# Patient Record
Sex: Female | Born: 2000
Health system: Southern US, Community
[De-identification: ages and names within clinical notes are randomized; demographics above are authoritative.]

## PROBLEM LIST (undated history)

## (undated) ENCOUNTER — Inpatient Hospital Stay (HOSPITAL_COMMUNITY): Payer: Self-pay

## (undated) ENCOUNTER — Inpatient Hospital Stay: Payer: Self-pay

## (undated) DIAGNOSIS — J45909 Unspecified asthma, uncomplicated: Secondary | ICD-10-CM

## (undated) DIAGNOSIS — R011 Cardiac murmur, unspecified: Secondary | ICD-10-CM

## (undated) HISTORY — DX: Cardiac murmur, unspecified: R01.1

## (undated) HISTORY — PX: NO PAST SURGERIES: SHX2092

## (undated) HISTORY — DX: Unspecified asthma, uncomplicated: J45.909

---
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
## 2019-01-24 ENCOUNTER — Encounter: Payer: Self-pay | Admitting: Obstetrics and Gynecology

## 2019-01-24 ENCOUNTER — Ambulatory Visit (INDEPENDENT_AMBULATORY_CARE_PROVIDER_SITE_OTHER): Payer: BC Managed Care – PPO | Admitting: Obstetrics and Gynecology

## 2019-01-24 ENCOUNTER — Ambulatory Visit (INDEPENDENT_AMBULATORY_CARE_PROVIDER_SITE_OTHER): Payer: BC Managed Care – PPO

## 2019-01-24 ENCOUNTER — Other Ambulatory Visit: Payer: Self-pay

## 2019-01-24 ENCOUNTER — Other Ambulatory Visit (HOSPITAL_COMMUNITY)
Admission: RE | Admit: 2019-01-24 | Discharge: 2019-01-24 | Disposition: A | Discharge status: Home / Self Care | Payer: BC Managed Care – PPO | Source: Ambulatory Visit | Attending: Obstetrics and Gynecology | Admitting: Obstetrics and Gynecology

## 2019-01-24 VITALS — BP 130/80 | Ht 62.5 in | Wt 162.0 lb

## 2019-01-24 DIAGNOSIS — R1031 Right lower quadrant pain: Secondary | ICD-10-CM | POA: Insufficient documentation

## 2019-01-24 DIAGNOSIS — Z113 Encounter for screening for infections with a predominantly sexual mode of transmission: Secondary | ICD-10-CM | POA: Insufficient documentation

## 2019-01-24 NOTE — Progress Notes (Signed)
Kaitlyn Slade, MD   Chief Complaint  Patient presents with  . Pelvic Pain    right side only x on/off one yr    HPI:      [name] is a 18 y.o. G0P0000 who LMP was No LMP recorded. Patient has had an implant., presents today for RLQ pressure/discomfort, intermittently for the past yr. Sx occur several day weekly. Pt notes loose stools when she has sx, normal BMs when doesn't have pressure sx. Tried gas-x without relief.  No fevers, LBP, urin sx, or vag sx. Pt is sex active, using condoms. No hx of STDs.  Nexplanon placed 01/09/17 for dysmen. Menses are random, light bleeding for 2 days, mild dysmen, improved with ibup. Dysmen improved with nexplanon.  No hx of ovar cysts.  No FH breast/ovar/colon ca.   Past Medical History:  Diagnosis Date  . Asthma    DR. PRINGLE  . Heart murmur    INNOCENT    Past Surgical History:  Procedure Laterality Date  . NO PAST SURGERIES      Family History  Problem Relation Age of Onset  . Hypertension Maternal Aunt   . Cancer Maternal Uncle 60  . Hypertension Maternal Grandmother   . Cancer Maternal Grandmother 47  . Breast cancer Other 60  . Cancer Other 27       COLON    Social History   Socioeconomic History  . Marital status: Single    Spouse name: Not on file  . Number of children: 0  . Years of education: 62  . Highest education level: Not on file  Occupational History  . Occupation: STUDENT    Comment: [employer]  Social Needs  . Financial resource strain: Not on file  . Food insecurity    Worry: Not on file    Inability: Not on file  . Transportation needs    Medical: Not on file    Non-medical: Not on file  Tobacco Use  . Smoking status: Never Smoker  . Smokeless tobacco: Never Used  Substance and Sexual Activity  . Alcohol use: Yes  . Drug use: Yes    Types: Marijuana  . Sexual activity: Not Currently    Birth control/protection: Implant  Lifestyle  . Physical activity    Days per  week: Not on file    Minutes per session: Not on file  . Stress: Not on file  Relationships  . Social Herbalist on phone: Not on file    Gets together: Not on file    Attends religious service: Not on file    Active member of club or organization: Not on file    Attends meetings of clubs or organizations: Not on file    Relationship status: Not on file  . Intimate partner violence    Fear of current or ex partner: Not on file    Emotionally abused: Not on file    Physically abused: Not on file    Forced sexual activity: Not on file  Other Topics Concern  . Not on file  Social History Narrative  . Not on file    Outpatient Medications Prior to Visit  Medication Sig Dispense Refill  . albuterol (PROVENTIL HFA;VENTOLIN HFA) 108 (90 Base) MCG/ACT inhaler Inhale into the lungs every 6 (six) hours as needed for wheezing or shortness of breath.    . meloxicam (MOBIC) 15 MG tablet Take 1 tablet (15 mg total) by mouth daily.  30 tablet 0   Facility-Administered Medications Prior to Visit  Medication Dose Route Frequency Provider Last Rate Last Dose  . etonogestrel (NEXPLANON) implant 68 mg  68 mg Subdermal Once Milliana Reddoch B, PA-C         ROS:  Review of Systems  Constitutional: Negative for fatigue, fever and unexpected weight change.  Respiratory: Negative for cough, shortness of breath and wheezing.   Cardiovascular: Negative for chest pain, palpitations and leg swelling.  Gastrointestinal: Positive for diarrhea and nausea. Negative for blood in stool, constipation and vomiting.  Endocrine: Negative for cold intolerance, heat intolerance and polyuria.  Genitourinary: Positive for pelvic pain. Negative for dyspareunia, dysuria, flank pain, frequency, genital sores, hematuria, menstrual problem, urgency, vaginal bleeding, vaginal discharge and vaginal pain.  Musculoskeletal: Negative for back pain, joint swelling and myalgias.  Skin: Negative for rash.  Neurological:  Negative for dizziness, syncope, light-headedness, numbness and headaches.  Hematological: Negative for adenopathy.  Psychiatric/Behavioral: Positive for agitation. Negative for confusion, sleep disturbance and suicidal ideas. The patient is not nervous/anxious.     OBJECTIVE:   Vitals:  BP 130/80   Ht 5' 2.5" (1.588 m)   Wt 162 lb (73.5 kg)   BMI 29.16 kg/m    Physical Exam Vitals signs reviewed.  Constitutional:      Appearance: She is well-developed.  Neck:     Musculoskeletal: Normal range of motion.  Pulmonary:     Effort: Pulmonary effort is normal.  Abdominal:     Palpations: Abdomen is soft.     Tenderness: There is no abdominal tenderness. There is no guarding or rebound.  Genitourinary:    General: Normal vulva.     Pubic Area: No rash.      Labia:        Right: No rash, tenderness or lesion.        Left: No rash, tenderness or lesion.      Vagina: Normal. No vaginal discharge, erythema or tenderness.     Cervix: Normal.     Uterus: Normal. Not enlarged and not tender.      Adnexa: Left adnexa normal.       Right: Tenderness present. No mass.         Left: No mass or tenderness.    Musculoskeletal: Normal range of motion.  Skin:    General: Skin is warm and dry.  Neurological:     General: No focal deficit present.     Mental Status: She is alert and oriented to person, place, and time.  Psychiatric:        Mood and Affect: Mood normal.        Behavior: Behavior normal.        Thought Content: Thought content normal.        Judgment: Judgment normal.     Results:  ULTRASOUND REPORT  Location: [facility]  Date of Service: 01/24/2019    Indications:Pelvic Pain Findings:  The uterus is anteverted and measures 6.8 x 3.7 x 3.1 cm. Echo texture is homogenous without evidence of focal masses. The Endometrium measures 2.6 mm.  Right Ovary measures 3.3 x 1.8 x 2.6 cm. It is normal in appearance. Left Ovary measures 3.2 x 2.8 x 1.7 cm. It is  normal in appearance. Survey of the adnexa demonstrates no adnexal masses. There is no free fluid in the cul de sac.  Impression: 1. Normal pelvic ultrasound.   Recommendations: 1.Clinical correlation with the patient's History and Physical Exam.  Deanna ArtisElyse S Fairbanks,  RT  Assessment/Plan: RLQ abdominal pain - Plan: US PELVIS TRANSVAGINAL NON-OB (TV ONLY), Cervicovaginal ancillary only; Neg GYN u/s, check STD testing. If neg, most likely GI etiology given concurrent loose stools with sx and pt to f/u with PCP. Can try immodium once daily with sx to see if improves.  Screening for STD (sexually transmitted disease) - Plan: Cervicovaginal ancillary only    Return if symptoms worsen or fail to improve.  Manoj Enriquez B. Dezirae Service, PA-C 01/24/2019 4:58 PM

## 2019-01-24 NOTE — Patient Instructions (Signed)
I value your feedback and entrusting us with your care. If you get a Kaitlyn Galloway patient survey, I would appreciate you taking the time to let us know about your experience today. Thank you! 

## 2019-01-26 LAB — CERVICOVAGINAL ANCILLARY ONLY
Chlamydia: POSITIVE — AB
Neisseria Gonorrhea: NEGATIVE

## 2019-01-27 ENCOUNTER — Encounter: Payer: Self-pay | Admitting: Obstetrics and Gynecology

## 2019-01-27 ENCOUNTER — Other Ambulatory Visit: Payer: Self-pay | Admitting: Obstetrics and Gynecology

## 2019-01-27 MED ORDER — AZITHROMYCIN 500 MG PO TABS: 1000.0000 mg | ORAL_TABLET | Freq: Once | ORAL | 0 refills | Status: AC

## 2019-01-27 NOTE — Progress Notes (Signed)
Rx azithro for chlamydia. Rx notified via [place]. RTO in 4 wks for TOC. Partner needs tx. RN to notify [facility].

## 2019-01-29 NOTE — Progress Notes (Signed)
[facility] notified. 

## 2019-03-19 ENCOUNTER — Ambulatory Visit: Payer: BC Managed Care – PPO | Admitting: Obstetrics and Gynecology

## 2019-03-25 ENCOUNTER — Encounter: Payer: Self-pay | Admitting: Maternal Newborn

## 2019-03-25 ENCOUNTER — Other Ambulatory Visit: Payer: Self-pay

## 2019-03-25 ENCOUNTER — Other Ambulatory Visit (HOSPITAL_COMMUNITY)
Admission: RE | Admit: 2019-03-25 | Discharge: 2019-03-25 | Disposition: A | Discharge status: Home / Self Care | Payer: BC Managed Care – PPO | Source: Ambulatory Visit | Attending: Obstetrics and Gynecology | Admitting: Obstetrics and Gynecology

## 2019-03-25 ENCOUNTER — Ambulatory Visit (INDEPENDENT_AMBULATORY_CARE_PROVIDER_SITE_OTHER): Payer: BC Managed Care – PPO | Admitting: Maternal Newborn

## 2019-03-25 VITALS — BP 118/74 | Ht 63.0 in | Wt 164.0 lb

## 2019-03-25 DIAGNOSIS — Z113 Encounter for screening for infections with a predominantly sexual mode of transmission: Secondary | ICD-10-CM

## 2019-03-25 NOTE — Progress Notes (Signed)
Obstetrics & Gynecology Office Visit   Chief Complaint:  Chief Complaint  Patient presents with  . Exposure to STD    History of Present Illness: Kaitlyn Galloway tested positive for chlamydia during a problem visit in September. She was treated with azithromycin. At that time, she was having some discomfort in her RLQ. These symptoms have since resolved. She is not having any symptoms today. She has returned for follow-up testing to ensure that she is free from chlamydia.  Review of Systems: Review of systems negative unless otherwise noted in HPI.  Past Medical History:  Past Medical History:  Diagnosis Date  . Asthma    DR. PRINGLE  . Heart murmur    INNOCENT    Past Surgical History:  Past Surgical History:  Procedure Laterality Date  . NO PAST SURGERIES      Gynecologic History: No LMP recorded. Patient has had an implant.  Obstetric History: G0P0000  Family History:  Family History  Problem Relation Age of Onset  . Hypertension Maternal Aunt   . Cancer Maternal Uncle 60  . Hypertension Maternal Grandmother   . Cancer Maternal Grandmother 25  . Breast cancer Other 60  . Cancer Other 90       COLON    Social History:  Social History   Socioeconomic History  . Marital status: Single    Spouse name: Not on file  . Number of children: 0  . Years of education: 52  . Highest education level: Not on file  Occupational History  . Occupation: STUDENT    Comment: [employer]  Social Needs  . Financial resource strain: Not on file  . Food insecurity    Worry: Not on file    Inability: Not on file  . Transportation needs    Medical: Not on file    Non-medical: Not on file  Tobacco Use  . Smoking status: Never Smoker  . Smokeless tobacco: Never Used  Substance and Sexual Activity  . Alcohol use: Yes  . Drug use: Yes    Types: Marijuana  . Sexual activity: Not Currently    Birth control/protection: Implant  Lifestyle  . Physical activity    Days  per week: Not on file    Minutes per session: Not on file  . Stress: Not on file  Relationships  . Social Musician on phone: Not on file    Gets together: Not on file    Attends religious service: Not on file    Active member of club or organization: Not on file    Attends meetings of clubs or organizations: Not on file    Relationship status: Not on file  . Intimate partner violence    Fear of current or ex partner: Not on file    Emotionally abused: Not on file    Physically abused: Not on file    Forced sexual activity: Not on file  Other Topics Concern  . Not on file  Social History Narrative  . Not on file    Allergies:  No Known Allergies  Medications: Prior to Admission medications   Medication Sig Start Date End Date Taking? Authorizing Provider  albuterol (PROVENTIL HFA;VENTOLIN HFA) 108 (90 Base) MCG/ACT inhaler Inhale into the lungs every 6 (six) hours as needed for wheezing or shortness of breath.    [provider]    Physical Exam Vitals:  Vitals:   03/25/19 1505  BP: 118/74   No LMP recorded.  Patient has had an implant.  General: NAD Pulmonary: No increased work of breathing Genitourinary:  External: Normal external female genitalia.  Normal urethral  meatus, normal Bartholin's and Skene's glands.    Vagina: Normal vaginal mucosa, no evidence of prolapse.    Lymphatic: no evidence of inguinal lymphadenopathy Extremities: no edema, erythema, or tenderness Neurologic: Grossly intact Psychiatric: mood appropriate, affect full  Assessment: 18 y.o. G0P0000 here for follow-up testing  Plan: Problem List Items Addressed This Visit    None    Visit Diagnoses    Screen for STD (sexually transmitted disease)    -  Primary   Relevant Orders   Cervicovaginal ancillary only     Will notify patient about test results when available.  Avel Sensor, CNM 03/25/2019  3:19 PM

## 2019-03-27 LAB — CERVICOVAGINAL ANCILLARY ONLY
Chlamydia: NEGATIVE
Comment: NEGATIVE
Comment: NORMAL
Neisseria Gonorrhea: NEGATIVE

## 2019-06-25 DIAGNOSIS — Z20828 Contact with and (suspected) exposure to other viral communicable diseases: Secondary | ICD-10-CM | POA: Diagnosis not present

## 2020-01-02 ENCOUNTER — Encounter: Payer: Self-pay | Admitting: Obstetrics and Gynecology

## 2020-01-02 ENCOUNTER — Ambulatory Visit (INDEPENDENT_AMBULATORY_CARE_PROVIDER_SITE_OTHER): Payer: BC Managed Care – PPO | Admitting: Obstetrics and Gynecology

## 2020-01-02 ENCOUNTER — Other Ambulatory Visit: Payer: Self-pay

## 2020-01-02 VITALS — BP 120/80 | Ht 63.0 in | Wt 153.0 lb

## 2020-01-02 DIAGNOSIS — Z3046 Encounter for surveillance of implantable subdermal contraceptive: Secondary | ICD-10-CM | POA: Diagnosis not present

## 2020-01-02 DIAGNOSIS — Z30011 Encounter for initial prescription of contraceptive pills: Secondary | ICD-10-CM

## 2020-01-02 MED ORDER — LEVONORGESTREL-ETHINYL ESTRAD 0.1-20 MG-MCG PO TABS: 1.0000 | ORAL_TABLET | Freq: Every day | ORAL | 0 refills | Status: DC

## 2020-01-02 NOTE — Patient Instructions (Addendum)
I value your feedback and entrusting us with your care. If you get a [facility] patient survey, I would appreciate you taking the time to let us know about your experience today. Thank you!  As of May 02, 2019, your lab results will be released to your MyChart immediately, before I even have a chance to see them. Please give me time to review them and contact you if there are any abnormalities. Thank you for your patience.   Remove the dressing in 24 hours,  keep the incision area dry for 24 hours and remove the Steristrip in 2-3  days.  Notify us if any signs of tenderness, redness, pain, or fevers develop.  

## 2020-01-02 NOTE — Progress Notes (Signed)
   Chief Complaint  Patient presents with  . Contraception     History of Present Illness:  Kaitlyn Galloway is a 19 y.o. that had a nexplanon placed approximately 3 years  ago. Since that time, she has irregular bleeding last 1 mo at a time. Did OCPs in past, and wants to resume. She is sex active, using condoms, too. Last STD testing 11/20.   BP 120/80   Ht 5\' 3"  (1.6 m)   Wt 153 lb (69.4 kg)   BMI 27.10 kg/m    Nexplanon removal Procedure note - The Nexplanon was noted in the patient's arm and the end was identified. The skin was cleansed with a Betadine solution. A small injection of subcutaneous lidocaine with epinephrine was given over the end of the implant. An incision was made at the end of the implant. The rod was noted in the incision and grasped with a hemostat. It was noted to be intact.  Steri-Strip was placed approximating the incision. Hemostasis was noted.  Assessment: Nexplanon removal  Encounter for initial prescription of contraceptive pills - Plan: levonorgestrel-ethinyl estradiol (AVIANE) 0.1-20 MG-MCG tablet; OCP start today. Condoms. F/u prn. RTO 3 months for annual.   Meds ordered this encounter  Medications  . levonorgestrel-ethinyl estradiol (AVIANE) 0.1-20 MG-MCG tablet    Sig: Take 1 tablet by mouth daily.    Dispense:  84 tablet    Refill:  0    Order Specific Question:   Supervising Provider    Answer:   02-16-2005 Nadara Mustard     Plan:   She was told to remove the dressing in 12-24 hours, to keep the incision area dry for 24 hours and to remove the Steristrip in 2-3  days.  Notify 05-07-1992 if any signs of tenderness, redness, pain, or fevers develop.   Anhad Sheeley B. Madelene Kaatz, PA-C 01/02/2020 3:16 PM

## 2020-03-22 ENCOUNTER — Other Ambulatory Visit: Payer: Self-pay | Admitting: Obstetrics and Gynecology

## 2020-03-22 DIAGNOSIS — Z30011 Encounter for initial prescription of contraceptive pills: Secondary | ICD-10-CM

## 2020-04-02 NOTE — Progress Notes (Signed)
PCP:  Ronnette Juniper, MD   Chief Complaint  Patient presents with  . Gynecologic Exam     HPI:      [name] is a 19 y.o. G0P0000 whose LMP was Patient's last menstrual period was 03/23/2020 (approximate)., presents today for her annual examination.  Her menses are regular every 28-30 days, lasting 5 days.  Dysmenorrhea none. She does not have intermenstrual bleeding.  Sex activity: single partner, contraception - OCP (estrogen/progesterone). Nexplanon removed 8/21, started on OCPs and doing well Last Pap: N/A due to age Hx of STDs: none  There is a FH of breast cancer in her PGGM, genetic testing not indicated. There is no FH of ovarian cancer. The patient does do self-breast exams.  Tobacco use: vapes daily Alcohol use: social  Daily marijuana use Exercise: moderately active  She does not get adequate calcium and Vitamin D in her diet. Unsure if Gardasil done   Upstream - 04/06/20 1328      Pregnancy Intention Screening   Does the patient want to become pregnant in the next year? No    Does the patient's partner want to become pregnant in the next year? No    Would the patient like to discuss contraceptive options today? Yes      Contraception Wrap Up   Current Method Oral Contraceptive          The pregnancy intention screening data noted above was reviewed. Potential methods of contraception were discussed. The patient elected to proceed with Oral Contraceptive.     Past Medical History:  Diagnosis Date  . Asthma    DR. PRINGLE  . Heart murmur    INNOCENT    Past Surgical History:  Procedure Laterality Date  . NO PAST SURGERIES      Family History  Problem Relation Age of Onset  . Hypertension Maternal Aunt   . Cancer Maternal Uncle 60  . Hypertension Maternal Grandmother   . Cancer Maternal Grandmother 21  . Breast cancer Other 60  . Cancer Other 90       COLON    Social History   Socioeconomic History  . Marital status:  Single    Spouse name: Not on file  . Number of children: 0  . Years of education: 24  . Highest education level: Not on file  Occupational History  . Occupation: STUDENT    Comment: [employer]  Tobacco Use  . Smoking status: Never Smoker  . Smokeless tobacco: Never Used  Vaping Use  . Vaping Use: Former  Substance and Sexual Activity  . Alcohol use: Yes  . Drug use: Yes    Types: Marijuana  . Sexual activity: Yes    Birth control/protection: Pill  Other Topics Concern  . Not on file  Social History Narrative  . Not on file   Social Determinants of Health   Financial Resource Strain:   . Difficulty of Paying Living Expenses: Not on file  Food Insecurity:   . Worried About Programme researcher, broadcasting/film/video in the Last Year: Not on file  . Ran Out of Food in the Last Year: Not on file  Transportation Needs:   . Lack of Transportation (Medical): Not on file  . Lack of Transportation (Non-Medical): Not on file  Physical Activity:   . Days of Exercise per Week: Not on file  . Minutes of Exercise per Session: Not on file  Stress:   . Feeling of Stress : Not on  file  Social Connections:   . Frequency of Communication with Friends and Family: Not on file  . Frequency of Social Gatherings with Friends and Family: Not on file  . Attends Religious Services: Not on file  . Active Member of Clubs or Organizations: Not on file  . Attends Banker Meetings: Not on file  . Marital Status: Not on file  Intimate Partner Violence:   . Fear of Current or Ex-Partner: Not on file  . Emotionally Abused: Not on file  . Physically Abused: Not on file  . Sexually Abused: Not on file     Current Outpatient Medications:  .  albuterol (PROVENTIL HFA;VENTOLIN HFA) 108 (90 Base) MCG/ACT inhaler, Inhale into the lungs every 6 (six) hours as needed for wheezing or shortness of breath., Disp: , Rfl:  .  levonorgestrel-ethinyl estradiol (AVIANE) 0.1-20 MG-MCG tablet, Take 1 tablet by  mouth daily., Disp: 84 tablet, Rfl: 3  Current Facility-Administered Medications:  .  etonogestrel (NEXPLANON) implant 68 mg, 68 mg, Subdermal, Once, Ercie Eliasen B, PA-C     ROS:  Review of Systems  Constitutional: Negative for fatigue, fever and unexpected weight change.  Respiratory: Negative for cough, shortness of breath and wheezing.   Cardiovascular: Negative for chest pain, palpitations and leg swelling.  Gastrointestinal: Negative for blood in stool, constipation, diarrhea, nausea and vomiting.  Endocrine: Negative for cold intolerance, heat intolerance and polyuria.  Genitourinary: Negative for dyspareunia, dysuria, flank pain, frequency, genital sores, hematuria, menstrual problem, pelvic pain, urgency, vaginal bleeding, vaginal discharge and vaginal pain.  Musculoskeletal: Negative for back pain, joint swelling and myalgias.  Skin: Negative for rash.  Neurological: Negative for dizziness, syncope, light-headedness, numbness and headaches.  Hematological: Negative for adenopathy.  Psychiatric/Behavioral: Negative for agitation, confusion, sleep disturbance and suicidal ideas. The patient is not nervous/anxious.   BREAST: No symptoms   Objective: BP 100/80   Ht 5\' 3"  (1.6 m)   Wt 155 lb (70.3 kg)   LMP 03/23/2020 (Approximate)   BMI 27.46 kg/m    Physical Exam Constitutional:      Appearance: She is well-developed.  Genitourinary:     Vulva, vagina, cervix, uterus, right adnexa and left adnexa normal.     No vulval lesion or tenderness noted.     No vaginal discharge, erythema or tenderness.     No cervical polyp.     Uterus is not enlarged or tender.     No right or left adnexal mass present.     Right adnexa not tender.     Left adnexa not tender.  Neck:     Thyroid: No thyromegaly.  Cardiovascular:     Rate and Rhythm: Normal rate and regular rhythm.     Heart sounds: Normal heart sounds. No murmur heard.   Pulmonary:     Effort: Pulmonary effort  is normal.     Breath sounds: Normal breath sounds.  Chest:     Breasts:        Right: No mass, nipple discharge, skin change or tenderness.        Left: No mass, nipple discharge, skin change or tenderness.  Abdominal:     Palpations: Abdomen is soft.     Tenderness: There is no abdominal tenderness. There is no guarding.  Musculoskeletal:        General: Normal range of motion.     Cervical back: Normal range of motion.  Neurological:     General: No focal deficit present.  Mental Status: She is alert and oriented to person, place, and time.     Cranial Nerves: No cranial nerve deficit.  Skin:    General: Skin is warm and dry.  Psychiatric:        Mood and Affect: Mood normal.        Behavior: Behavior normal.        Thought Content: Thought content normal.        Judgment: Judgment normal.  Vitals reviewed.     Assessment/Plan: Encounter for annual routine gynecological examination  Screening for STD (sexually transmitted disease) - Plan: Cervicovaginal ancillary only  Encounter for surveillance of contraceptive pills - Plan: levonorgestrel-ethinyl estradiol (AVIANE) 0.1-20 MG-MCG tablet  Meds ordered this encounter  Medications  . levonorgestrel-ethinyl estradiol (AVIANE) 0.1-20 MG-MCG tablet    Sig: Take 1 tablet by mouth daily.    Dispense:  84 tablet    Refill:  3    Order Specific Question:   Supervising Provider    Answer:   Nadara Mustard [[id number]]             GYN counsel adequate intake of calcium and vitamin D, diet and exercise, Gardasil; d/c vaping, marijuana     F/U  Return in about 1 year (around 04/06/2021).  Neriah Brott B. Sharla Tankard, PA-C 04/06/2020 1:49 PM

## 2020-04-02 NOTE — Patient Instructions (Signed)
I value your feedback and entrusting us with your care. If you get a [facility] patient survey, I would appreciate you taking the time to let us know about your experience today. Thank you!  As of May 02, 2019, your lab results will be released to your MyChart immediately, before I even have a chance to see them. Please give me time to review them and contact you if there are any abnormalities. Thank you for your patience.  

## 2020-04-06 ENCOUNTER — Other Ambulatory Visit: Payer: Self-pay

## 2020-04-06 ENCOUNTER — Encounter: Payer: Self-pay | Admitting: Obstetrics and Gynecology

## 2020-04-06 ENCOUNTER — Ambulatory Visit (INDEPENDENT_AMBULATORY_CARE_PROVIDER_SITE_OTHER): Payer: BC Managed Care – PPO | Admitting: Obstetrics and Gynecology

## 2020-04-06 ENCOUNTER — Other Ambulatory Visit (HOSPITAL_COMMUNITY)
Admission: RE | Admit: 2020-04-06 | Discharge: 2020-04-06 | Disposition: A | Discharge status: Home / Self Care | Payer: BC Managed Care – PPO | Source: Ambulatory Visit | Attending: Obstetrics and Gynecology | Admitting: Obstetrics and Gynecology

## 2020-04-06 VITALS — BP 100/80 | Ht 63.0 in | Wt 155.0 lb

## 2020-04-06 DIAGNOSIS — Z113 Encounter for screening for infections with a predominantly sexual mode of transmission: Secondary | ICD-10-CM | POA: Diagnosis not present

## 2020-04-06 DIAGNOSIS — Z01419 Encounter for gynecological examination (general) (routine) without abnormal findings: Secondary | ICD-10-CM | POA: Diagnosis not present

## 2020-04-06 DIAGNOSIS — Z3041 Encounter for surveillance of contraceptive pills: Secondary | ICD-10-CM | POA: Diagnosis not present

## 2020-04-06 MED ORDER — LEVONORGESTREL-ETHINYL ESTRAD 0.1-20 MG-MCG PO TABS: 1.0000 | ORAL_TABLET | Freq: Every day | ORAL | 3 refills | Status: DC

## 2020-04-08 LAB — CERVICOVAGINAL ANCILLARY ONLY
Chlamydia: NEGATIVE
Comment: NEGATIVE
Comment: NORMAL
Neisseria Gonorrhea: NEGATIVE

## 2020-10-13 ENCOUNTER — Other Ambulatory Visit: Payer: Self-pay

## 2020-10-13 ENCOUNTER — Encounter: Payer: Self-pay | Admitting: Obstetrics and Gynecology

## 2020-10-13 ENCOUNTER — Other Ambulatory Visit (HOSPITAL_COMMUNITY)
Admission: RE | Admit: 2020-10-13 | Discharge: 2020-10-13 | Disposition: A | Discharge status: Home / Self Care | Payer: BC Managed Care – PPO | Source: Ambulatory Visit | Attending: Obstetrics and Gynecology | Admitting: Obstetrics and Gynecology

## 2020-10-13 ENCOUNTER — Ambulatory Visit (INDEPENDENT_AMBULATORY_CARE_PROVIDER_SITE_OTHER): Payer: BC Managed Care – PPO | Admitting: Obstetrics and Gynecology

## 2020-10-13 VITALS — BP 100/60 | HR 88 | Wt 155.0 lb

## 2020-10-13 DIAGNOSIS — O281 Abnormal biochemical finding on antenatal screening of mother: Secondary | ICD-10-CM | POA: Diagnosis not present

## 2020-10-13 DIAGNOSIS — N912 Amenorrhea, unspecified: Secondary | ICD-10-CM

## 2020-10-13 DIAGNOSIS — Z7185 Encounter for immunization safety counseling: Secondary | ICD-10-CM

## 2020-10-13 DIAGNOSIS — O219 Vomiting of pregnancy, unspecified: Secondary | ICD-10-CM

## 2020-10-13 DIAGNOSIS — Z3401 Encounter for supervision of normal first pregnancy, first trimester: Secondary | ICD-10-CM

## 2020-10-13 DIAGNOSIS — Z1159 Encounter for screening for other viral diseases: Secondary | ICD-10-CM | POA: Diagnosis not present

## 2020-10-13 DIAGNOSIS — Z113 Encounter for screening for infections with a predominantly sexual mode of transmission: Secondary | ICD-10-CM

## 2020-10-13 DIAGNOSIS — Z130 Encounter for screening for diseases of the blood and blood-forming organs and certain disorders involving the immune mechanism: Secondary | ICD-10-CM

## 2020-10-13 DIAGNOSIS — Z124 Encounter for screening for malignant neoplasm of cervix: Secondary | ICD-10-CM

## 2020-10-13 DIAGNOSIS — Z3A01 Less than 8 weeks gestation of pregnancy: Secondary | ICD-10-CM

## 2020-10-13 LAB — POCT URINALYSIS DIPSTICK OB
Appearance: ABNORMAL
Bilirubin, UA: NEGATIVE
Blood, UA: NEGATIVE
Glucose, UA: NEGATIVE
Leukocytes, UA: NEGATIVE
Nitrite, UA: NEGATIVE
Odor: NORMAL
Spec Grav, UA: 1.025 (ref 1.010–1.025)
Urobilinogen, UA: 0.2 E.U./dL
pH, UA: 6 (ref 5.0–8.0)

## 2020-10-13 LAB — POCT URINE PREGNANCY: Preg Test, Ur: POSITIVE — AB

## 2020-10-13 MED ORDER — PRENATABS RX 29-1 MG PO TABS: 1.0000 | ORAL_TABLET | Freq: Every day | ORAL | 4 refills | Status: DC

## 2020-10-13 MED ORDER — DOXYLAMINE-PYRIDOXINE 10-10 MG PO TBEC: 2.0000 | DELAYED_RELEASE_TABLET | Freq: Every day | ORAL | 5 refills | Status: DC

## 2020-10-13 NOTE — Patient Instructions (Signed)
First Trimester of Pregnancy  The first trimester of pregnancy starts on the first day of your last menstrual period until the end of week 12. This is months 1 through 3 of pregnancy. A week after a sperm fertilizes an egg, the egg will implant into the wall of the uterus and begin to develop into a baby. By the end of 12 weeks, all the baby's organs will be formed and the baby will be 2-3 inches in size. Body changes during your first trimester Your body goes through many changes during pregnancy. The changes vary and generally return to normal after your baby is born. Physical changes  You may gain or lose weight.  Your breasts may begin to grow larger and become tender. The tissue that surrounds your nipples (areola) may become darker.  Dark spots or blotches (chloasma or mask of pregnancy) may develop on your face.  You may have changes in your hair. These can include thickening or thinning of your hair or changes in texture. Health changes  You may feel nauseous, and you may vomit.  You may have heartburn.  You may develop headaches.  You may develop constipation.  Your gums may bleed and may be sensitive to brushing and flossing. Other changes  You may tire easily.  You may urinate more often.  Your menstrual periods will stop.  You may have a loss of appetite.  You may develop cravings for certain kinds of food.  You may have changes in your emotions from day to day.  You may have more vivid and strange dreams. Follow these instructions at home: Medicines  Follow your health care provider's instructions regarding medicine use. Specific medicines may be either safe or unsafe to take during pregnancy. Do not take any medicines unless told to by your health care provider.  Take a prenatal vitamin that contains at least 600 micrograms (mcg) of folic acid. Eating and drinking  Eat a healthy diet that includes fresh fruits and vegetables, whole grains, good sources of  protein such as meat, eggs, or tofu, and low-fat dairy products.  Avoid raw meat and unpasteurized juice, milk, and cheese. These carry germs that can harm you and your baby.  If you feel nauseous or you vomit: ? Eat 4 or 5 small meals a day instead of 3 large meals. ? Try eating a few soda crackers. ? Drink liquids between meals instead of during meals.  You may need to take these actions to prevent or treat constipation: ? Drink enough fluid to keep your urine pale yellow. ? Eat foods that are high in fiber, such as beans, whole grains, and fresh fruits and vegetables. ? Limit foods that are high in fat and processed sugars, such as fried or sweet foods. Activity  Exercise only as directed by your health care provider. Most people can continue their usual exercise routine during pregnancy. Try to exercise for 30 minutes at least 5 days a week.  Stop exercising if you develop pain or cramping in the lower abdomen or lower back.  Avoid exercising if it is very hot or humid or if you are at high altitude.  Avoid heavy lifting.  If you choose to, you may have sex unless your health care provider tells you not to. Relieving pain and discomfort  Wear a good support bra to relieve breast tenderness.  Rest with your legs elevated if you have leg cramps or low back pain.  If you develop bulging veins (varicose veins) in   your legs: ? Wear support hose as told by your health care provider. ? Elevate your feet for 15 minutes, 3-4 times a day. ? Limit salt in your diet. Safety  Wear your seat belt at all times when driving or riding in a car.  Talk with your health care provider if someone is verbally or physically abusive to you.  Talk with your health care provider if you are feeling sad or have thoughts of hurting yourself. Lifestyle  Do not use hot tubs, steam rooms, or saunas.  Do not douche. Do not use tampons or scented sanitary pads.  Do not use herbal remedies, alcohol,  illegal drugs, or medicines that are not approved by your health care provider. Chemicals in these products can harm your baby.  Do not use any products that contain nicotine or tobacco, such as cigarettes, e-cigarettes, and chewing tobacco. If you need help quitting, ask your health care provider.  Avoid cat litter boxes and soil used by cats. These carry germs that can cause birth defects in the baby and possibly loss of the unborn baby (fetus) by miscarriage or stillbirth. General instructions  During routine prenatal visits in the first trimester, your health care provider will do a physical exam, perform necessary tests, and ask you how things are going. Keep all follow-up visits. This is important.  Ask for help if you have counseling or nutritional needs during pregnancy. Your health care provider can offer advice or refer you to specialists for help with various needs.  Schedule a dentist appointment. At home, brush your teeth with a soft toothbrush. Floss gently.  Write down your questions. Take them to your prenatal visits. Where to find more information  American Pregnancy Association: americanpregnancy.org  American College of Obstetricians and Gynecologists: acog.org/en/Womens%20Health/Pregnancy  Office on Women's Health: womenshealth.gov/pregnancy Contact a health care provider if you have:  Dizziness.  A fever.  Mild pelvic cramps, pelvic pressure, or nagging pain in the abdominal area.  Nausea, vomiting, or diarrhea that lasts for 24 hours or longer.  A bad-smelling vaginal discharge.  Pain when you urinate.  Known exposure to a contagious illness, such as chickenpox, measles, Zika virus, HIV, or hepatitis. Get help right away if you have:  Spotting or bleeding from your vagina.  Severe abdominal cramping or pain.  Shortness of breath or chest pain.  Any kind of trauma, such as from a fall or a car crash.  New or increased pain, swelling, or redness in an  arm or leg. Summary  The first trimester of pregnancy starts on the first day of your last menstrual period until the end of week 12 (months 1 through 3).  Eating 4 or 5 small meals a day rather than 3 large meals may help to relieve nausea and vomiting.  Do not use any products that contain nicotine or tobacco, such as cigarettes, e-cigarettes, and chewing tobacco. If you need help quitting, ask your health care provider.  Keep all follow-up visits. This is important. This information is not intended to replace advice given to you by your health care provider. Make sure you discuss any questions you have with your health care provider. Document Revised: 10/16/2019 Document Reviewed: 08/22/2019 Elsevier Patient Education  2021 [employer]  

## 2020-10-13 NOTE — Progress Notes (Signed)
New Obstetric Patient H&P    Chief Complaint: Missed period, +UPT   History of Present Illness: Patient is a 20 y.o. G1P0 Not Hispanic or Latino female, presents with amenorrhea and positive home pregnancy test. Patient's last menstrual period was 08/26/2020 (approximate). and based on her  LMP, her EDD is Estimated Date of Delivery: 06/02/21 and her EGA is [redacted]w[redacted]d. Cycles are irregular, occurring every 28-42 days, and last about 5 days. She recently turned 20 and has not had her first pap smear yet.   She had a urine pregnancy test which was positive 5 day(s)  ago. Her last menstrual period was normal and lasted for  5 day(s). Since her LMP she claims she has experienced N/V, diarrhea, and fatigue. She denies vaginal bleeding. Her past medical history is significant for asthma, she reports she last used her inhaler several months ago. This is her first pregnancy.  Since her LMP, she admits to the use of tobacco products  yes She reports she has gained  5 pounds since the start of her pregnancy.  There are cats in the home in the home  no She admits close contact with children on a regular basis  yes  She has had chicken pox in the past no She has had Tuberculosis exposures, symptoms, or previously tested positive for TB   no Current or past history of domestic violence. no  Genetic Screening/Teratology Counseling: (Includes patient, baby's father, or anyone in either family with:)   1. Patient's age >/= 56 at [facility]  no 2. Thalassemia ([place], [place], Mediterranean, or Asian background): MCV<80  no 3. Neural tube defect (meningomyelocele, spina bifida, anencephaly)  no 4. Congenital heart defect  no  5. Down syndrome  no 6. Tay-Sachs (Jewish, [place]  no 7. Canavan's Disease  no 8. Sickle cell disease or trait (African)  yes - reports family member with sickle cell trait 9. Hemophilia or other blood disorders  no  10. Muscular dystrophy  no  11. Cystic fibrosis  no  12.  Huntington's Chorea  no  13. Mental retardation/autism  no 14. Other inherited genetic or chromosomal disorder  no 15. Maternal metabolic disorder (DM, PKU, etc)  no 16. Patient or FOB with a child with a birth defect not listed above no  16a. Patient or FOB with a birth defect themselves no 17. Recurrent pregnancy loss, or stillbirth  no  18. Any medications since LMP other than prenatal vitamins (include vitamins, supplements, OTC meds, drugs, alcohol)  no 19. Any other genetic/environmental exposure to discuss  no  Infection History:   1. Lives with someone with TB or TB exposed  no  2. Patient or partner has history of genital herpes  no 3. Rash or viral illness since LMP  no 4. History of STI (GC, CT, HPV, syphilis, HIV)  no 5. History of recent travel :  no  Other pertinent information:  Lives with mother. BF present at visit today. Patient is currently a Consulting civil engineer at [employer] and with a minor in African American studies.    Review of Systems:10 point review of systems negative unless otherwise noted in HPI  Past Medical History:  Patient Active Problem List   Diagnosis Date Noted  . Encounter for supervision of normal first pregnancy in first trimester 10/13/2020     Nursing Staff Provider  Office Location  [facility] Dating   LMP  Language  English Anatomy US    Flu Vaccine   declined Genetic  Screen  NIPS:   desires  TDaP vaccine    Hgb A1C or  GTT Early : n/a Third trimester :   Rhogam     LAB RESULTS   Feeding Plan  breast Blood Type     Contraception  Antibody    Circumcision  Rubella    Pediatrician   RPR     Support Person  boyfriend HBsAg     Prenatal Classes  HIV      Varicella @[email or web address] @   BTL Consent  GBS  (For PCN allergy, check sensitivities)        VBAC Consent  n/a Pap  collected 10/13/20    Hgb Electro   collected  Covid-19  Unvaccinated CF  considering     SMA  considering           . Nausea/vomiting in  pregnancy 10/13/2020  . Asthma, well controlled 12/26/2013    Past Surgical History:  Past Surgical History:  Procedure Laterality Date  . NO PAST SURGERIES      Gynecologic History: Patient's last menstrual period was 08/26/2020 (approximate).  Obstetric History: G1P0  Family History:  Family History  Problem Relation Age of Onset  . Hypertension Maternal Aunt   . Cancer Maternal Uncle 60  . Hypertension Maternal Grandmother   . Cancer Maternal Grandmother 64  . Breast cancer Other 60  . Cancer Other 90       COLON    Social History:  Social History   Socioeconomic History  . Marital status: Single    Spouse name: Not on file  . Number of children: 0  . Years of education: 45  . Highest education level: Not on file  Occupational History  . Occupation: STUDENT    Comment: [employer]  Tobacco Use  . Smoking status: Never Smoker  . Smokeless tobacco: Never Used  Vaping Use  . Vaping Use: Former  Substance and Sexual Activity  . Alcohol use: Not Currently  . Drug use: Yes    Types: Marijuana  . Sexual activity: Yes    Birth control/protection: None  Other Topics Concern  . Not on file  Social History Narrative  . Not on file   Social Determinants of Health   Financial Resource Strain: Not on file  Food Insecurity: Not on file  Transportation Needs: Not on file  Physical Activity: Not on file  Stress: Not on file  Social Connections: Not on file  Intimate Partner Violence: Not on file    Allergies:  No Known Allergies  Medications: Prior to Admission medications   Medication Sig Start Date End Date Taking? Authorizing Provider  albuterol (PROVENTIL HFA;VENTOLIN HFA) 108 (90 Base) MCG/ACT inhaler Inhale into the lungs every 6 (six) hours as needed for wheezing or shortness of breath.   Yes [provider]  Doxylamine-Pyridoxine (DICLEGIS) 10-10 MG TBEC Take 2 tablets by mouth at bedtime. If symptoms persist, add one tablet in the  morning and one in the afternoon 10/13/20  Yes Francesco Provencal, CNM  Prenatal Vit-Iron Carbonyl-FA (PRENATABS RX) 29-1 MG TABS Take 1 tablet by mouth daily. 10/13/20  Yes Cayden Rautio, 10/15/20, CNM  levonorgestrel-ethinyl estradiol (AVIANE) 0.1-20 MG-MCG tablet Take 1 tablet by mouth daily. Patient not taking: Reported on 10/13/2020 04/06/20   [place], 04/08/20    Physical Exam Vitals: Blood pressure 100/60, pulse 88, weight 155 lb (70.3 kg), last menstrual period 08/26/2020.  General: NAD HEENT: normocephalic, anicteric Thyroid: no enlargement, no palpable nodules Pulmonary: No  increased work of breathing, CTAB Cardiovascular: RRR, distal pulses 2+ Abdomen: NABS, soft, non-tender, non-distended.  Umbilicus without lesions.  No hepatomegaly, splenomegaly or masses palpable. No evidence of hernia  Genitourinary:  External: Normal external female genitalia.  Normal urethral meatus, normal  Bartholin's and Skene's glands.    Vagina: Normal vaginal mucosa, no evidence of prolapse.    Cervix: Grossly normal in appearance, no bleeding  Uterus: Non-enlarged, mobile, normal contour.  No CMT  Adnexa: ovaries non-enlarged, no adnexal masses  Rectal: deferred Extremities: no edema, erythema, or tenderness Neurologic: Grossly intact Psychiatric: mood appropriate, affect full  Assessment: 20 y.o. G1P0 at [redacted]w[redacted]d presenting to initiate prenatal care  Plan: 1) Avoid alcoholic beverages. 2) Patient encouraged not to smoke.  3) Discontinue the use of all non-medicinal drugs and chemicals.  4) Take prenatal vitamins daily.  5) Nutrition, food safety (fish, cheese advisories, and high nitrite foods) and exercise discussed. 6) Hospital and practice style discussed with cross coverage system.  7) Genetic Screening, such as with 1st Trimester Screening, cell free fetal DNA, AFP testing, and Ultrasound, as well as with amniocentesis and CVS as appropriate, is discussed with patient. At the conclusion of today's  visit patient requested genetic testing - Desires NIPS at next visit, considering Inheritest. Sickle cell screening today.  8) [facility] labs/Pap/GC/CT/urine collected today  9) Diclegis for N/V - follow-up if symptoms persisit  10) Return in about 3 weeks (around 11/03/2020) for ROB with MD for dating [place] and NIPS.   Zipporah Plants, CNM, MSN [facility], [facility] 10/13/2020, 3:49 PM

## 2020-10-15 ENCOUNTER — Other Ambulatory Visit: Payer: Self-pay | Admitting: Obstetrics and Gynecology

## 2020-10-15 ENCOUNTER — Telehealth: Payer: Self-pay

## 2020-10-15 DIAGNOSIS — O219 Vomiting of pregnancy, unspecified: Secondary | ICD-10-CM

## 2020-10-15 LAB — CYTOLOGY - PAP
Adequacy: ABSENT
Chlamydia: NEGATIVE
Comment: NEGATIVE
Comment: NEGATIVE
Comment: NORMAL
Diagnosis: NEGATIVE
Neisseria Gonorrhea: NEGATIVE
Trichomonas: NEGATIVE

## 2020-10-15 LAB — RPR+RH+ABO+RUB AB+AB SCR+CB...
Antibody Screen: NEGATIVE
HIV Screen 4th Generation wRfx: NONREACTIVE
Hematocrit: 39 % (ref 34.0–46.6)
Hemoglobin: 13.2 g/dL (ref 11.1–15.9)
Hepatitis B Surface Ag: NEGATIVE
MCH: 29.7 pg (ref 26.6–33.0)
MCHC: 33.8 g/dL (ref 31.5–35.7)
MCV: 88 fL (ref 79–97)
Platelets: 219 10*3/uL (ref 150–450)
RBC: 4.44 x10E6/uL (ref 3.77–5.28)
RDW: 13.4 % (ref 11.7–15.4)
RPR Ser Ql: NONREACTIVE
Rh Factor: POSITIVE
Rubella Antibodies, IGG: 5.27 index (ref >0.99)
Varicella zoster IgG: 266 index (ref >165)
WBC: 4.5 10*3/uL (ref 3.4–10.8)

## 2020-10-15 LAB — HEPATITIS C ANTIBODY: Hep C Virus Ab: <0.1 s/co ratio (ref 0.0–0.9)

## 2020-10-15 LAB — URINE CULTURE

## 2020-10-15 LAB — HGB FRACTIONATION CASCADE
Hgb A2: 2.8 % (ref 1.8–3.2)
Hgb A: 97.2 % (ref 96.4–98.8)
Hgb F: 0 % (ref 0.0–2.0)
Hgb S: 0 %

## 2020-10-15 MED ORDER — PROMETHAZINE HCL 25 MG PO TABS: 25.0000 mg | ORAL_TABLET | Freq: Four times a day (QID) | ORAL | 2 refills | Status: DC | PRN

## 2020-10-15 NOTE — Telephone Encounter (Signed)
Spoke w/Stephanie (on [place]). She reports last time patient kept anything down for more than 30 minutes was yesterday. Advised on 24 hour rule-Report to ED may need fluids. Patient had moderate ketones on urinalysis on Tuesday. Had advised while here on ways to remain hydrated: popsicles, ice chips etc. Advised on B6 & Unisom (refer to safe meds list for instructions on amount and how to take). Will send to [facility] for review of different rx.

## 2020-10-15 NOTE — Telephone Encounter (Signed)
Judeth Cornfield, patient's mom, reports Diclegis is $640, generic is $500. Requesting a more cost effective rx. Also reports patient has not kept anything down (water, pedialite). Cb#[phone]

## 2020-10-15 NOTE — Telephone Encounter (Signed)
Stephanie aware

## 2020-11-03 ENCOUNTER — Other Ambulatory Visit: Payer: Self-pay

## 2020-11-03 ENCOUNTER — Ambulatory Visit (INDEPENDENT_AMBULATORY_CARE_PROVIDER_SITE_OTHER): Payer: BC Managed Care – PPO | Admitting: Obstetrics and Gynecology

## 2020-11-03 VITALS — BP 118/68 | Wt 157.0 lb

## 2020-11-03 DIAGNOSIS — Z1379 Encounter for other screening for genetic and chromosomal anomalies: Secondary | ICD-10-CM

## 2020-11-03 DIAGNOSIS — Z3401 Encounter for supervision of normal first pregnancy, first trimester: Secondary | ICD-10-CM

## 2020-11-03 DIAGNOSIS — Z3A09 9 weeks gestation of pregnancy: Secondary | ICD-10-CM

## 2020-11-03 DIAGNOSIS — Z369 Encounter for antenatal screening, unspecified: Secondary | ICD-10-CM | POA: Diagnosis not present

## 2020-11-03 DIAGNOSIS — Z3689 Encounter for other specified antenatal screening: Secondary | ICD-10-CM | POA: Diagnosis not present

## 2020-11-03 LAB — POCT URINALYSIS DIPSTICK OB
Glucose, UA: NEGATIVE
POC,PROTEIN,UA: NEGATIVE

## 2020-11-03 NOTE — Progress Notes (Signed)
    Routine Prenatal Care Visit  Subjective  Kaitlyn Galloway is a 20 y.o. G1P0 at [redacted]w[redacted]d being seen today for ongoing prenatal care.  She is currently monitored for the following issues for this low-risk pregnancy and has Asthma, well controlled; Encounter for supervision of normal first pregnancy in first trimester; and Nausea/vomiting in pregnancy on their problem list.  ----------------------------------------------------------------------------------- Patient reports no complaints.   Contractions: Not present. Vag. Bleeding: None.  Movement: Absent. Denies leaking of fluid.  ----------------------------------------------------------------------------------- The following portions of the patient's history were reviewed and updated as appropriate: allergies, current medications, past family history, past medical history, past social history, past surgical history and problem list. Problem list updated.   Objective  Blood pressure 118/68, weight 157 lb (71.2 kg), last menstrual period 08/26/2020. Pregravid weight 150 lb (68 kg) Total Weight Gain 7 lb (3.175 kg) Urinalysis:      Fetal Status: Fetal Heart Rate (bpm): 180   Movement: Absent     General:  Alert, oriented and cooperative. Patient is in no acute distress.  Skin: Skin is warm and dry. No rash noted.   Cardiovascular: Normal heart rate noted  Respiratory: Normal respiratory effort, no problems with respiration noted  Abdomen: Soft, gravid, appropriate for gestational age. Pain/Pressure: Absent     Pelvic:  Cervical exam deferred        Extremities: Normal range of motion.     ental Status: Normal mood and affect. Normal behavior. Normal judgment and thought content.     Assessment   20 y.o. G1P0 at [redacted]w[redacted]d by  06/02/2021, by Last Menstrual Period presenting for routine prenatal visit  Plan   Pregnancy #1 Problems (from 10/13/20 to present)     Problem Noted Resolved   Encounter for supervision of normal first pregnancy in  first trimester 10/13/2020 by Zipporah Plants, CNM No   Overview Addendum 10/15/2020  2:31 PM by Zipporah Plants, CNM     Nursing Staff Provider  Office Location  [facility] Dating   LMP  Language  English Anatomy US    Flu Vaccine   declined Genetic Screen  NIPS:   desires  TDaP vaccine    Hgb A1C or  GTT Early : n/a Third trimester :   Rhogam   n/a   LAB RESULTS   Feeding Plan  breast Blood Type   A+  Contraception  Antibody   Negative  Circumcision  Rubella   Immune  Pediatrician   RPR   Non-reactive  Support Person  boyfriend HBsAg   Negative  Prenatal Classes  resources provided at [facility] HIV   Non-reactive    Varicella   Immune  BTL Consent  n/a GBS  (For PCN allergy, check sensitivities)        VBAC Consent  n/a Pap  collected 10/13/20    Hgb Electro   Normal hgb  Covid-19  Unvaccinated CF  considering     SMA  considering                   Gestational age appropriate obstetric precautions including but not limited to vaginal bleeding, contractions, leaking of fluid and fetal movement were reviewed in detail with the patient.    1) Dating scan - S=D today  2) Genetic - MaterniT21 drawn, declines inheritest.    Return in about 4 weeks (around 12/01/2020) for ROB.  Vena [place], MD, Evern Core [facility], [facility] 11/03/2020, 2:40 PM

## 2020-11-03 NOTE — Progress Notes (Signed)
ROB - dating scan, no concerns  [place] RM 2

## 2020-11-03 NOTE — Progress Notes (Signed)
Dating scan  Singleton viable IUP [redacted]w[redacted]d by CRL 2.74cm (2.76cm, 2.67cm, 2.78cm) ultrasound derived EDD 06/04/2021 consistent LMP EDD of 06/02/2021  FHT 180BMP Yolk sac 0.52cm ROV corpus luteum cyst 2.54 x 2.49cm LOV normal No uterine fibroid No free fluid  There is a viable singleton gestation.  The fetal biometry correlates with established dating. Detailed evaluation of the fetal anatomy is precluded by early gestational age.  It must be noted that a normal ultrasound particular at this early gestational age is unable to rule out fetal aneuploidy, risk of first trimester miscarriage, or anatomic birth defects.  Kaitlyn [place], MD, Evern Core [facility], [facility] 11/03/2020, 2:40 PM

## 2020-11-08 LAB — MATERNIT 21 PLUS CORE, BLOOD
Fetal Fraction: 9
Result (T21): NEGATIVE
Trisomy 13 (Patau syndrome): NEGATIVE
Trisomy 18 (Edwards syndrome): NEGATIVE
Trisomy 21 (Down syndrome): NEGATIVE

## 2020-12-01 ENCOUNTER — Ambulatory Visit (INDEPENDENT_AMBULATORY_CARE_PROVIDER_SITE_OTHER): Payer: BC Managed Care – PPO | Admitting: Obstetrics and Gynecology

## 2020-12-01 ENCOUNTER — Encounter: Payer: Self-pay | Admitting: Obstetrics and Gynecology

## 2020-12-01 ENCOUNTER — Other Ambulatory Visit: Payer: Self-pay

## 2020-12-01 VITALS — BP 114/70 | Ht 63.0 in | Wt 157.2 lb

## 2020-12-01 DIAGNOSIS — Z3402 Encounter for supervision of normal first pregnancy, second trimester: Secondary | ICD-10-CM

## 2020-12-01 DIAGNOSIS — Z3A13 13 weeks gestation of pregnancy: Secondary | ICD-10-CM

## 2020-12-01 LAB — POCT URINALYSIS DIPSTICK OB
Glucose, UA: NEGATIVE
POC,PROTEIN,UA: NEGATIVE

## 2020-12-01 NOTE — Patient Instructions (Signed)

## 2020-12-01 NOTE — Progress Notes (Signed)
    Routine Prenatal Care Visit  Subjective  Kaitlyn Galloway is a 20 y.o. Kaitlyn Galloway at [redacted]w[redacted]d being seen today for ongoing prenatal care.  She is currently monitored for the following issues for this low-risk pregnancy and has Asthma, well controlled; Encounter for supervision of normal first pregnancy in first trimester; and Nausea/vomiting in pregnancy on their problem list.  ----------------------------------------------------------------------------------- Patient reports no complaints.   Contractions: Not present. Vag. Bleeding: None.  Movement: Absent. Denies leaking of fluid.  ----------------------------------------------------------------------------------- The following portions of the patient's history were reviewed and updated as appropriate: allergies, current medications, past family history, past medical history, past social history, past surgical history and problem list. Problem list updated.   Objective  Blood pressure 114/70, height 5\' 3"  (1.6 m), weight 157 lb 3.2 oz (71.3 kg), last menstrual period 08/26/2020. Pregravid weight 150 lb (68 kg) Total Weight Gain 7 lb 3.2 oz (3.266 kg) Urinalysis:      Fetal Status: Fetal Heart Rate (bpm): 160   Movement: Absent     General:  Alert, oriented and cooperative. Patient is in no acute distress.  Skin: Skin is warm and dry. No rash noted.   Cardiovascular: Normal heart rate noted  Respiratory: Normal respiratory effort, no problems with respiration noted  Abdomen: Soft, gravid, appropriate for gestational age. Pain/Pressure: Absent     Pelvic:  Cervical exam deferred        Extremities: Normal range of motion.     Mental Status: Normal mood and affect. Normal behavior. Normal judgment and thought content.     Assessment   Kaitlyn y.o. Kaitlyn Galloway at [redacted]w[redacted]d by  06/02/2021, by Last Menstrual Period presenting for routine prenatal visit  Plan   Pregnancy #1 Problems (from 10/13/20 to present)     Problem Noted Resolved   Encounter for  supervision of normal first pregnancy in first trimester 10/13/2020 by 10/15/2020, CNM No   Overview Addendum 11/09/2020  7:37 PM by 11/11/2020, MD     Nursing Staff Provider  Office Location  [facility] Dating   LMP=9week Vena [place]  Language  English Anatomy US    Flu Vaccine   declined Genetic Screen  NIPS: normal XX  TDaP vaccine    Hgb A1C or  GTT Early : n/a Third trimester :   Rhogam   n/a   LAB RESULTS   Feeding Plan  breast Blood Type   A+  Contraception  Antibody   Negative  Circumcision  Rubella   Immune  Pediatrician   RPR   Non-reactive  Support Person  boyfriend HBsAg   Negative  Prenatal Classes  resources provided at [facility] HIV   Non-reactive    Varicella   Immune  BTL Consent  n/a GBS  (For PCN allergy, check sensitivities)        VBAC Consent  n/a Pap  collected 10/13/20    Hgb Electro   Normal hgb  Covid-19  Unvaccinated CF  considering     SMA  considering                   Gestational age appropriate obstetric precautions including but not limited to vaginal bleeding, contractions, leaking of fluid and fetal movement were reviewed in detail with the patient.    Return in about 3 weeks (around 12/22/2020) for ROB in person.  02/21/2021 MD [facility], [facility] 12/01/2020, 9:26 AM

## 2020-12-22 ENCOUNTER — Ambulatory Visit (INDEPENDENT_AMBULATORY_CARE_PROVIDER_SITE_OTHER): Payer: BC Managed Care – PPO | Admitting: Obstetrics

## 2020-12-22 ENCOUNTER — Other Ambulatory Visit: Payer: Self-pay

## 2020-12-22 ENCOUNTER — Encounter: Payer: Self-pay | Admitting: Obstetrics & Gynecology

## 2020-12-22 VITALS — BP 110/68 | Wt 161.0 lb

## 2020-12-22 DIAGNOSIS — Z3A16 16 weeks gestation of pregnancy: Secondary | ICD-10-CM

## 2020-12-22 DIAGNOSIS — Z3402 Encounter for supervision of normal first pregnancy, second trimester: Secondary | ICD-10-CM

## 2020-12-22 LAB — POCT URINALYSIS DIPSTICK OB
Glucose, UA: NEGATIVE
POC,PROTEIN,UA: NEGATIVE

## 2020-12-22 NOTE — Progress Notes (Signed)
ROB - no concerns. RM 5 

## 2020-12-22 NOTE — Progress Notes (Signed)
  Routine Prenatal Care Visit  Subjective  Kaitlyn Galloway is a 20 y.o. G1P0 at [redacted]w[redacted]d being seen today for ongoing prenatal care.  She is currently monitored for the following issues for this low-risk pregnancy and has Asthma, well controlled; Encounter for supervision of normal first pregnancy in first trimester; and Nausea/vomiting in pregnancy on their problem list.  ----------------------------------------------------------------------------------- Patient reports no complaints.  She is no longer having the nausea she had previously.  .  .   Kaitlyn Galloway denies.  ----------------------------------------------------------------------------------- The following portions of the patient's history were reviewed and updated as appropriate: allergies, current medications, past family history, past medical history, past social history, past surgical history and problem list. Problem list updated.  Objective  Blood pressure 110/68, weight 161 lb (73 kg), last menstrual period 08/26/2020. Pregravid weight 150 lb (68 kg) Total Weight Gain 11 lb (4.99 kg) Urinalysis: Urine Protein Negative  Urine Glucose Negative  Fetal Status:           General:  Alert, oriented and cooperative. Patient is in no acute distress.  Skin: Skin is warm and dry. No rash noted.   Cardiovascular: Normal heart rate noted  Respiratory: Normal respiratory effort, no problems with respiration noted  Abdomen: Soft, gravid, appropriate for gestational age.       Pelvic:  Cervical exam deferred        Extremities: Normal range of motion.     Mental Status: Normal mood and affect. Normal behavior. Normal judgment and thought content.   Assessment   20 y.o. G1P0 at [redacted]w[redacted]d by  06/02/2021, by Last Menstrual Period presenting for routine prenatal visit  Plan   Pregnancy #1 Problems (from 10/13/20 to present)    Problem Noted Resolved   Encounter for supervision of normal first pregnancy in first trimester 10/13/2020 by Kaitlyn Galloway, CNM No   Overview Addendum 11/09/2020  7:37 PM by Kaitlyn [place], MD     Nursing Staff Provider  Office Location  [facility] Dating   LMP=9week [place]  Language  English Anatomy US    Flu Vaccine   declined Genetic Screen  NIPS: normal XX  TDaP vaccine    Hgb A1C or  GTT Early : n/a Third trimester :   Rhogam   n/a   LAB RESULTS   Feeding Plan  breast Blood Type   A+  Contraception  Antibody   Negative  Circumcision  Rubella   Immune  Pediatrician   RPR   Non-reactive  Support Person  boyfriend HBsAg   Negative  Prenatal Classes  resources provided at [facility] HIV   Non-reactive    Varicella   Immune  BTL Consent  n/a GBS  (For PCN allergy, check sensitivities)        VBAC Consent  n/a Pap  collected 10/13/20    Hgb Electro   Normal hgb  Covid-19  Unvaccinated CF  considering     SMA  considering                  Preterm labor symptoms and general obstetric precautions including but not limited to vaginal bleeding, contractions, leaking of Galloway and fetal movement were reviewed in detail with the patient. Please refer to After Visit Summary for other counseling recommendations.  Anatomy scan is ordered for her for 3-4 weeks out.  Return in about 4 weeks (around 01/19/2021) for return OB.  Kaitlyn Galloway, CNM  12/22/2020 2:51 PM

## 2021-01-18 ENCOUNTER — Other Ambulatory Visit: Payer: Self-pay

## 2021-01-18 ENCOUNTER — Ambulatory Visit
Admission: RE | Admit: 2021-01-18 | Discharge: 2021-01-18 | Disposition: A | Discharge status: Home / Self Care | Payer: BC Managed Care – PPO | Source: Ambulatory Visit | Attending: Obstetrics | Admitting: Obstetrics

## 2021-01-18 DIAGNOSIS — Z3492 Encounter for supervision of normal pregnancy, unspecified, second trimester: Secondary | ICD-10-CM | POA: Diagnosis not present

## 2021-01-18 DIAGNOSIS — Z3A20 20 weeks gestation of pregnancy: Secondary | ICD-10-CM | POA: Diagnosis not present

## 2021-01-18 DIAGNOSIS — Z3402 Encounter for supervision of normal first pregnancy, second trimester: Secondary | ICD-10-CM | POA: Diagnosis not present

## 2021-01-19 ENCOUNTER — Ambulatory Visit (INDEPENDENT_AMBULATORY_CARE_PROVIDER_SITE_OTHER): Payer: BC Managed Care – PPO | Admitting: Obstetrics

## 2021-01-19 ENCOUNTER — Encounter: Payer: BC Managed Care – PPO | Admitting: Obstetrics and Gynecology

## 2021-01-19 VITALS — BP 116/72 | Wt 171.0 lb

## 2021-01-19 DIAGNOSIS — Z3401 Encounter for supervision of normal first pregnancy, first trimester: Secondary | ICD-10-CM

## 2021-01-19 DIAGNOSIS — Z3A20 20 weeks gestation of pregnancy: Secondary | ICD-10-CM

## 2021-01-19 LAB — POCT URINALYSIS DIPSTICK OB
Glucose, UA: NEGATIVE
POC,PROTEIN,UA: NEGATIVE

## 2021-01-19 NOTE — Progress Notes (Signed)
ROB - no concerns. RM 5 

## 2021-01-19 NOTE — Progress Notes (Signed)
  Routine Prenatal Care Visit  Subjective  Kaitlyn Galloway is a 20 y.o. G1P0 at [redacted]w[redacted]d being seen today for ongoing prenatal care.  She is currently monitored for the following issues for this low-risk pregnancy and has Asthma, well controlled; Encounter for supervision of normal first pregnancy in first trimester; and Nausea/vomiting in pregnancy on their problem list.  ----------------------------------------------------------------------------------- Patient reports no complaints.  She just had her anatomy scan yesterday- all normal findings.  .  .   Kaitlyn Galloway denies.  ----------------------------------------------------------------------------------- The following portions of the patient's history were reviewed and updated as appropriate: allergies, current medications, past family history, past medical history, past social history, past surgical history and problem list. Problem list updated.  Objective  Blood pressure 116/72, weight 171 lb (77.6 kg), last menstrual period 08/26/2020. Pregravid weight 150 lb (68 kg) Total Weight Gain 21 lb (9.526 kg) Urinalysis: Urine Protein Negative  Urine Glucose Negative  Fetal Status:           General:  Alert, oriented and cooperative. Patient is in no acute distress.  Skin: Skin is warm and dry. No rash noted.   Cardiovascular: Normal heart rate noted  Respiratory: Normal respiratory effort, no problems with respiration noted  Abdomen: Soft, gravid, appropriate for gestational age.       Pelvic:  Cervical exam deferred        Extremities: Normal range of motion.     Mental Status: Normal mood and affect. Normal behavior. Normal judgment and thought content.   Assessment   20 y.o. G1P0 at 103w6d by  06/02/2021, by Last Menstrual Period presenting for routine prenatal visit  Plan   Pregnancy #1 Problems (from 10/13/20 to present)    Problem Noted Resolved   Encounter for supervision of normal first pregnancy in first trimester  10/13/2020 by Zipporah Plants, CNM No   Overview Addendum 01/19/2021  3:46 PM by [name], CNM     Nursing Staff Provider  Office Location  [facility] Dating   LMP=9week [place]  Language  English Anatomy US  Completed 8/29  Flu Vaccine   declined Genetic Screen  NIPS: normal XX  TDaP vaccine    Hgb A1C or  GTT Early : n/a Third trimester :   Rhogam   n/a   LAB RESULTS   Feeding Plan  breast Blood Type   A+  Contraception  Antibody   Negative  Circumcision  Rubella   Immune  Pediatrician   RPR   Non-reactive  Support Person  boyfriend HBsAg   Negative  Prenatal Classes  resources provided at [facility] HIV   Non-reactive    Varicella   Immune  BTL Consent  n/a GBS  (For PCN allergy, check sensitivities)        VBAC Consent  n/a Pap  collected 10/13/20    Hgb Electro   Normal hgb  Covid-19  Unvaccinated CF  considering     SMA  considering                  Preterm labor symptoms and general obstetric precautions including but not limited to vaginal bleeding, contractions, leaking of Galloway and fetal movement were reviewed in detail with the patient. Please refer to After Visit Summary for other counseling recommendations.   Return in about 4 weeks (around 02/16/2021) for return OB.  [name], CNM  01/19/2021 3:47 PM

## 2021-02-16 ENCOUNTER — Other Ambulatory Visit: Payer: Self-pay

## 2021-02-16 ENCOUNTER — Ambulatory Visit (INDEPENDENT_AMBULATORY_CARE_PROVIDER_SITE_OTHER): Payer: BC Managed Care – PPO | Admitting: Obstetrics

## 2021-02-16 VITALS — BP 120/70 | Wt 179.8 lb

## 2021-02-16 DIAGNOSIS — Z3A24 24 weeks gestation of pregnancy: Secondary | ICD-10-CM

## 2021-02-16 DIAGNOSIS — Z3402 Encounter for supervision of normal first pregnancy, second trimester: Secondary | ICD-10-CM

## 2021-02-16 LAB — POCT URINALYSIS DIPSTICK OB
Glucose, UA: NEGATIVE
POC,PROTEIN,UA: NEGATIVE

## 2021-02-16 NOTE — Progress Notes (Signed)
Routine Prenatal Care Visit  Subjective  Kaitlyn Galloway is a 20 y.o. G1P0 at [redacted]w[redacted]d being seen today for ongoing prenatal care.  She is currently monitored for the following issues for this low-risk pregnancy and has Asthma, well controlled; Encounter for supervision of normal first pregnancy in first trimester; and Nausea/vomiting in pregnancy on their problem list.  ----------------------------------------------------------------------------------- Patient reports no complaints.   Contractions: Not present. Vag. Bleeding: None.  Movement: Present. Leaking Fluid denies.  ----------------------------------------------------------------------------------- The following portions of the patient's history were reviewed and updated as appropriate: allergies, current medications, past family history, past medical history, past social history, past surgical history and problem list. Problem list updated.  Objective  Blood pressure 120/70, weight 179 lb 12.8 oz (81.6 kg), last menstrual period 08/26/2020. Pregravid weight 150 lb (68 kg) Total Weight Gain 29 lb 12.8 oz (13.5 kg) Urinalysis: Urine Protein    Urine Glucose    Fetal Status:     Movement: Present     General:  Alert, oriented and cooperative. Patient is in no acute distress.  Skin: Skin is warm and dry. No rash noted.   Cardiovascular: Normal heart rate noted  Respiratory: Normal respiratory effort, no problems with respiration noted  Abdomen: Soft, gravid, appropriate for gestational age. Pain/Pressure: Absent     Pelvic:  Cervical exam deferred        Extremities: Normal range of motion.     Mental Status: Normal mood and affect. Normal behavior. Normal judgment and thought content.   Assessment   20 y.o. G1P0 at [redacted]w[redacted]d by  06/02/2021, by Last Menstrual Period presenting for routine prenatal visit  Plan   Pregnancy #1 Problems (from 10/13/20 to present)    Problem Noted Resolved   Encounter for supervision of normal first  pregnancy in first trimester 10/13/2020 by Zipporah Plants, CNM No   Overview Addendum 01/19/2021  3:46 PM by [name], CNM     Nursing Staff Provider  Office Location  [facility] Dating   LMP=9week [place]  Language  English Anatomy US  Completed 8/29  Flu Vaccine   declined Genetic Screen  NIPS: normal XX  TDaP vaccine    Hgb A1C or  GTT Early : n/a Third trimester :   Rhogam   n/a   LAB RESULTS   Feeding Plan  breast Blood Type   A+  Contraception  Antibody   Negative  Circumcision  Rubella   Immune  Pediatrician   RPR   Non-reactive  Support Person  boyfriend HBsAg   Negative  Prenatal Classes  resources provided at [facility] HIV   Non-reactive    Varicella   Immune  BTL Consent  n/a GBS  (For PCN allergy, check sensitivities)        VBAC Consent  n/a Pap  collected 10/13/20    Hgb Electro   Normal hgb  Covid-19  Unvaccinated CF  considering     SMA  considering                  Preterm labor symptoms and general obstetric precautions including but not limited to vaginal bleeding, contractions, leaking of fluid and fetal movement were reviewed in detail with the patient. Please refer to After Visit Summary for other counseling recommendations.   Return in about 4 weeks (around 03/16/2021) for return OB, 1hr GTT, 28 week labs.  Measuring larger for dates. Consider growth scan in future for S>D [name], CNM  02/16/2021 10:40 AM

## 2021-02-16 NOTE — Addendum Note (Signed)
Addended by: Clement Husbands A on: 02/16/2021 11:01 AM   Modules accepted: Orders

## 2021-03-16 ENCOUNTER — Ambulatory Visit (INDEPENDENT_AMBULATORY_CARE_PROVIDER_SITE_OTHER): Payer: BC Managed Care – PPO | Admitting: Obstetrics

## 2021-03-16 ENCOUNTER — Other Ambulatory Visit: Payer: Self-pay

## 2021-03-16 ENCOUNTER — Other Ambulatory Visit: Payer: BC Managed Care – PPO

## 2021-03-16 VITALS — BP 112/70 | Ht 63.0 in | Wt 186.6 lb

## 2021-03-16 DIAGNOSIS — Z3A28 28 weeks gestation of pregnancy: Secondary | ICD-10-CM

## 2021-03-16 DIAGNOSIS — Z3402 Encounter for supervision of normal first pregnancy, second trimester: Secondary | ICD-10-CM

## 2021-03-16 LAB — POCT URINALYSIS DIPSTICK OB
Glucose, UA: NEGATIVE
POC,PROTEIN,UA: NEGATIVE

## 2021-03-16 NOTE — Progress Notes (Signed)
  Routine Prenatal Care Visit  Subjective  Kaitlyn Galloway is a 20 y.o. G1P0 at [redacted]w[redacted]d being seen today for ongoing prenatal care.  She is currently monitored for the following issues for this low-risk pregnancy and has Asthma, well controlled; Encounter for supervision of normal first pregnancy in first trimester; and Nausea/vomiting in pregnancy on their problem list.  ----------------------------------------------------------------------------------- Patient reports no complaints.  She 8is having her 28 week labs today. Contractions: Not present. Vag. Bleeding: None.  Movement: Present. Leaking Fluid denies.  ----------------------------------------------------------------------------------- The following portions of the patient's history were reviewed and updated as appropriate: allergies, current medications, past family history, past medical history, past social history, past surgical history and problem list. Problem list updated.  Objective  Blood pressure 112/70, height 5\' 3"  (1.6 m), weight 186 lb 9.6 oz (84.6 kg), last menstrual period 08/26/2020. Pregravid weight 150 lb (68 kg) Total Weight Gain 36 lb 9.6 oz (16.6 kg) Urinalysis: Urine Protein    Urine Glucose    Fetal Status:     Movement: Present     General:  Alert, oriented and cooperative. Patient is in no acute distress.  Skin: Skin is warm and dry. No rash noted.   Cardiovascular: Normal heart rate noted  Respiratory: Normal respiratory effort, no problems with respiration noted  Abdomen: Soft, gravid, appropriate for gestational age. Pain/Pressure: Absent     Pelvic:  Cervical exam deferred        Extremities: Normal range of motion.     Mental Status: Normal mood and affect. Normal behavior. Normal judgment and thought content.   Assessment   20 y.o. G1P0 at [redacted]w[redacted]d by  06/02/2021, by Last Menstrual Period presenting for routine prenatal visit  Plan   Pregnancy #1 Problems (from 10/13/20 to present)    Problem Noted  Resolved   Encounter for supervision of normal first pregnancy in first trimester 10/13/2020 by 10/15/2020, CNM No   Overview Addendum 01/19/2021  3:46 PM by 01/21/2021, CNM     Nursing Staff Provider  Office Location  [facility] Dating   LMP=9week Kaitlyn Galloway  Language  English Anatomy US  Completed 8/29  Flu Vaccine   declined Genetic Screen  NIPS: normal XX  TDaP vaccine    Hgb A1C or  GTT Early : n/a Third trimester :   Rhogam   n/a   LAB RESULTS   Feeding Plan  breast Blood Type   A+  Contraception  Antibody   Negative  Circumcision  Rubella   Immune  Pediatrician   RPR   Non-reactive  Support Person  boyfriend HBsAg   Negative  Prenatal Classes  resources provided at [facility] HIV   Non-reactive    Varicella   Immune  BTL Consent  n/a GBS  (For PCN allergy, check sensitivities)        VBAC Consent  n/a Pap  collected 10/13/20    Hgb Electro   Normal hgb  Covid-19  Unvaccinated CF  considering     SMA  considering                  Preterm labor symptoms and general obstetric precautions including but not limited to vaginal bleeding, contractions, leaking of fluid and fetal movement were reviewed in detail with the patient. Please refer to After Visit Summary for other counseling recommendations.  Glucose challenge today.  Return in about 2 weeks (around 03/30/2021) for return OB.  13/12/2020, CNM  03/16/2021 8:32 AM

## 2021-03-17 ENCOUNTER — Other Ambulatory Visit: Payer: Self-pay | Admitting: Obstetrics

## 2021-03-17 ENCOUNTER — Encounter: Payer: Self-pay | Admitting: Obstetrics

## 2021-03-17 DIAGNOSIS — O99013 Anemia complicating pregnancy, third trimester: Secondary | ICD-10-CM | POA: Insufficient documentation

## 2021-03-17 LAB — 28 WEEK RH+PANEL
Basophils Absolute: 0 10*3/uL (ref 0.0–0.2)
Basos: 1 %
EOS (ABSOLUTE): 0.1 10*3/uL (ref 0.0–0.4)
Eos: 2 %
Gestational Diabetes Screen: 76 mg/dL (ref 70–139)
HIV Screen 4th Generation wRfx: NONREACTIVE
Hematocrit: 26.8 % — ABNORMAL LOW (ref 34.0–46.6)
Hemoglobin: 8.5 g/dL — ABNORMAL LOW (ref 11.1–15.9)
Immature Grans (Abs): 0 10*3/uL (ref 0.0–0.1)
Immature Granulocytes: 1 %
Lymphocytes Absolute: 1 10*3/uL (ref 0.7–3.1)
Lymphs: 20 %
MCH: 26.9 pg (ref 26.6–33.0)
MCHC: 31.7 g/dL (ref 31.5–35.7)
MCV: 85 fL (ref 79–97)
Monocytes Absolute: 0.3 10*3/uL (ref 0.1–0.9)
Monocytes: 6 %
Neutrophils Absolute: 3.4 10*3/uL (ref 1.4–7.0)
Neutrophils: 70 %
Platelets: 211 10*3/uL (ref 150–450)
RBC: 3.16 x10E6/uL — ABNORMAL LOW (ref 3.77–5.28)
RDW: 12.8 % (ref 11.7–15.4)
RPR Ser Ql: NONREACTIVE
WBC: 4.9 10*3/uL (ref 3.4–10.8)

## 2021-03-31 ENCOUNTER — Other Ambulatory Visit: Payer: Self-pay

## 2021-03-31 ENCOUNTER — Ambulatory Visit (INDEPENDENT_AMBULATORY_CARE_PROVIDER_SITE_OTHER): Payer: BC Managed Care – PPO | Admitting: Obstetrics

## 2021-03-31 VITALS — BP 120/80 | Wt 187.0 lb

## 2021-03-31 DIAGNOSIS — Z3402 Encounter for supervision of normal first pregnancy, second trimester: Secondary | ICD-10-CM

## 2021-03-31 DIAGNOSIS — Z3A31 31 weeks gestation of pregnancy: Secondary | ICD-10-CM

## 2021-03-31 LAB — POCT URINALYSIS DIPSTICK OB
Glucose, UA: NEGATIVE
POC,PROTEIN,UA: NEGATIVE

## 2021-03-31 NOTE — Progress Notes (Signed)
Routine Prenatal Care Visit  Subjective  Kaitlyn Galloway is a 20 y.o. G1P0 at [redacted]w[redacted]d being seen today for ongoing prenatal care.  She is currently monitored for the following issues for this low-risk pregnancy and has Asthma, well controlled; Encounter for supervision of normal first pregnancy in first trimester; Nausea/vomiting in pregnancy; and Anemia in pregnancy, third trimester on their problem list.  ----------------------------------------------------------------------------------- Patient reports no complaints.  She has not been taking an iron supplement. Contractions: Not present. Vag. Bleeding: None.  Movement: Present. Leaking Fluid denies.  ----------------------------------------------------------------------------------- The following portions of the patient's history were reviewed and updated as appropriate: allergies, current medications, past family history, past medical history, past social history, past surgical history and problem list. Problem list updated.  Objective  Blood pressure 120/80, weight 187 lb (84.8 kg), last menstrual period 08/26/2020. Pregravid weight 150 lb (68 kg) Total Weight Gain 37 lb (16.8 kg) Urinalysis: Urine Protein Negative  Urine Glucose Negative  Fetal Status:     Movement: Present     General:  Alert, oriented and cooperative. Patient is in no acute distress.  Skin: Skin is warm and dry. No rash noted.   Cardiovascular: Normal heart rate noted  Respiratory: Normal respiratory effort, no problems with respiration noted  Abdomen: Soft, gravid, appropriate for gestational age. Pain/Pressure: Present     Pelvic:  Cervical exam deferred        Extremities: Normal range of motion.     Mental Status: Normal mood and affect. Normal behavior. Normal judgment and thought content.   Assessment   20 y.o. G1P0 at [redacted]w[redacted]d by  06/02/2021, by Last Menstrual Period presenting for routine prenatal visit  Plan   Pregnancy #1 Problems (from 10/13/20 to  present)    Problem Noted Resolved   Anemia in pregnancy, third trimester 03/17/2021 by [name], CNM No   Overview Signed 03/17/2021 10:14 PM by [name], CNM    H and H at 28 weeks : 8.5/26.8 Iron advised, and will plan to recheck her at [redacted] weeks gestation.      Encounter for supervision of normal first pregnancy in first trimester 10/13/2020 by Zipporah Plants, CNM No   Overview Addendum 03/17/2021 10:14 PM by [name], CNM     Nursing Staff Provider  Office Location  [facility]ating   LMP=9week [place]  Language  English Anatomy US  Completed 8/29  Flu Vaccine   declined Genetic Screen  NIPS: normal XX  TDaP vaccine    Hgb A1C or  GTT Early : n/a Third trimester : 79  Rhogam   n/a   LAB RESULTS   Feeding Plan  breast Blood Type   A+  Contraception  Antibody   Negative  Circumcision  Rubella   Immune  Pediatrician   RPR   Non-reactive  Support Person  boyfriend HBsAg   Negative  Prenatal Classes  resources provided at [facility] HIV   Non-reactive    Varicella   Immune  BTL Consent  n/a GBS  (For PCN allergy, check sensitivities)        VBAC Consent  n/a Pap  collected 10/13/20    Hgb Electro   Normal hgb  Covid-19  Unvaccinated CF  considering     SMA  considering                  Preterm labor symptoms and general obstetric precautions including but not limited to vaginal bleeding, contractions, leaking of fluid and fetal movement  were reviewed in detail with the patient. Please refer to After Visit Summary for other counseling recommendations.  Again , provided info on anemia in pregnancy, and she is given samples of iron tabs to take. Will check her CBC at next visit ( order is in) and refer to Hem for IV infusions if needed.  Return in about 2 weeks (around 04/14/2021) for return OB, please draw CBC (anemia).  [name], CNM  03/31/2021 11:37 AM

## 2021-04-13 ENCOUNTER — Other Ambulatory Visit: Payer: Self-pay

## 2021-04-13 ENCOUNTER — Ambulatory Visit (INDEPENDENT_AMBULATORY_CARE_PROVIDER_SITE_OTHER): Payer: BC Managed Care – PPO | Admitting: Obstetrics

## 2021-04-13 VITALS — BP 100/70 | Wt 189.0 lb

## 2021-04-13 DIAGNOSIS — O99013 Anemia complicating pregnancy, third trimester: Secondary | ICD-10-CM

## 2021-04-13 DIAGNOSIS — Z3400 Encounter for supervision of normal first pregnancy, unspecified trimester: Secondary | ICD-10-CM | POA: Diagnosis not present

## 2021-04-13 DIAGNOSIS — Z3401 Encounter for supervision of normal first pregnancy, first trimester: Secondary | ICD-10-CM

## 2021-04-13 NOTE — Progress Notes (Signed)
Routine Prenatal Care Visit  Subjective  Kaitlyn Galloway is a 20 y.o. G1P0 at [redacted]w[redacted]d being seen today for ongoing prenatal care.  She is currently monitored for the following issues for this low-risk pregnancy and has Asthma, well controlled; Encounter for supervision of normal first pregnancy in first trimester; Nausea/vomiting in pregnancy; and Anemia in pregnancy, third trimester on their problem list.  Finished BF classes, unable to take CBE d/t schedule.  ----------------------------------------------------------------------------------- Patient reports no complaints.    .  .   [name] denies.  ----------------------------------------------------------------------------------- The following portions of the patient's history were reviewed and updated as appropriate: allergies, current medications, past family history, past medical history, past social history, past surgical history and problem list. Problem list updated.  Objective  Blood pressure 100/70, weight 189 lb (85.7 kg), last menstrual period 08/26/2020. Pregravid weight 150 lb (68 kg) Total Weight Gain 39 lb (17.7 kg)   General:  Alert, oriented and cooperative. Patient is in no acute distress.  Skin: Skin is warm and dry. No rash noted.   Cardiovascular: Normal heart rate noted  Respiratory: Normal respiratory effort, no problems with respiration noted  Abdomen: Soft, gravid, appropriate for gestational age.       Pelvic:  Cervical exam deferred        Extremities: Normal range of motion.     Mental Status: Normal mood and affect. Normal behavior. Normal judgment and thought content.   Assessment   20 y.o. G1P0 at [redacted]w[redacted]d by  06/02/2021, by Last Menstrual Period presenting for routine prenatal visit  Plan   Pregnancy #1 Problems (from 10/13/20 to present)     Problem Noted Resolved   Anemia in pregnancy, third trimester 03/17/2021 by [name], CNM No   Overview Signed 03/17/2021 10:14 PM by [name], CNM    H and H at 28 weeks : 8.5/26.8 Iron advised, and will plan to recheck her at [redacted] weeks gestation.      Encounter for supervision of normal first pregnancy in first trimester 10/13/2020 by Zipporah Plants, CNM No   Overview Addendum 03/17/2021 10:14 PM by [name], CNM     Nursing Staff Provider  Office Location  [facility] Dating   LMP=9week [place]  Language  English Anatomy US  Completed 8/29  Flu Vaccine   declined Genetic Screen  NIPS: normal XX  TDaP vaccine    Hgb A1C or  GTT Early : n/a Third trimester : 79  Rhogam   n/a   LAB RESULTS   Feeding Plan  breast Blood Type   A+  Contraception  Antibody   Negative  Circumcision  Rubella   Immune  Pediatrician   RPR   Non-reactive  Support Person  boyfriend HBsAg   Negative  Prenatal Classes  resources provided at [facility] HIV   Non-reactive    Varicella   Immune  BTL Consent  n/a GBS  (For PCN allergy, check sensitivities)        VBAC Consent  n/a Pap  collected 10/13/20    Hgb Electro   Normal hgb  Covid-19  Unvaccinated CF  considering     SMA  considering                   Preterm labor symptoms and general obstetric precautions including but not limited to vaginal bleeding, contractions, leaking of fluid and fetal movement were reviewed in detail with the patient. Please refer to After Visit Summary for other counseling  recommendations.  CBC for anemia today  Return in about 2 weeks (around 04/27/2021) for rob.  Carie Caddy, CNM  Domingo Pulse, [facility]  04/13/21  11:30 AM

## 2021-04-13 NOTE — Progress Notes (Signed)
ROB- no concerns 

## 2021-04-14 LAB — CBC WITH DIFFERENTIAL/PLATELET
Basophils Absolute: 0 10*3/uL (ref 0.0–0.2)
Basos: 1 %
EOS (ABSOLUTE): 0.1 10*3/uL (ref 0.0–0.4)
Eos: 1 %
Hematocrit: 28.6 % — ABNORMAL LOW (ref 34.0–46.6)
Hemoglobin: 8.8 g/dL — ABNORMAL LOW (ref 11.1–15.9)
Immature Grans (Abs): 0 10*3/uL (ref 0.0–0.1)
Immature Granulocytes: 1 %
Lymphocytes Absolute: 1.1 10*3/uL (ref 0.7–3.1)
Lymphs: 25 %
MCH: 24.8 pg — ABNORMAL LOW (ref 26.6–33.0)
MCHC: 30.8 g/dL — ABNORMAL LOW (ref 31.5–35.7)
MCV: 81 fL (ref 79–97)
Monocytes Absolute: 0.2 10*3/uL (ref 0.1–0.9)
Monocytes: 5 %
Neutrophils Absolute: 3 10*3/uL (ref 1.4–7.0)
Neutrophils: 67 %
Platelets: 233 10*3/uL (ref 150–450)
RBC: 3.55 x10E6/uL — ABNORMAL LOW (ref 3.77–5.28)
RDW: 13.8 % (ref 11.7–15.4)
WBC: 4.3 10*3/uL (ref 3.4–10.8)

## 2021-04-20 ENCOUNTER — Encounter: Payer: Self-pay | Admitting: Licensed Practical Nurse

## 2021-04-20 ENCOUNTER — Other Ambulatory Visit: Payer: Self-pay | Admitting: Licensed Practical Nurse

## 2021-04-20 DIAGNOSIS — O99013 Anemia complicating pregnancy, third trimester: Secondary | ICD-10-CM

## 2021-04-20 NOTE — Progress Notes (Signed)
Minimal change in H/H at 32 weeks, referral sent to hem-onc for potential iron transfusion. Carie Caddy, CNM  Domingo Pulse, [facility]  04/20/21  1:07 PM

## 2021-04-27 ENCOUNTER — Ambulatory Visit (INDEPENDENT_AMBULATORY_CARE_PROVIDER_SITE_OTHER): Payer: BC Managed Care – PPO | Admitting: Obstetrics

## 2021-04-27 ENCOUNTER — Other Ambulatory Visit: Payer: Self-pay

## 2021-04-27 VITALS — BP 118/74 | Wt 192.0 lb

## 2021-04-27 DIAGNOSIS — Z3A35 35 weeks gestation of pregnancy: Secondary | ICD-10-CM

## 2021-04-27 DIAGNOSIS — Z3400 Encounter for supervision of normal first pregnancy, unspecified trimester: Secondary | ICD-10-CM

## 2021-04-27 NOTE — Progress Notes (Signed)
No vb. No lof.  

## 2021-04-27 NOTE — Progress Notes (Signed)
Routine Prenatal Care Visit  Subjective  Kaitlyn Galloway is a 20 y.o. G1P0 at [redacted]w[redacted]d being seen today for ongoing prenatal care.  She is currently monitored for the following issues for this high-risk pregnancy and has Asthma, well controlled; Encounter for supervision of normal first pregnancy in first trimester; Nausea/vomiting in pregnancy; and Anemia in pregnancy, third trimester on their problem list.  ----------------------------------------------------------------------------------- Patient reports no complaints.  She is taking her daily iron, and has an appoitment with Hem Onc this week at the infusion center. Contractions: Not present. Vag. Bleeding: None.  Movement: Present. Leaking Fluid denies.  ----------------------------------------------------------------------------------- The following portions of the patient's history were reviewed and updated as appropriate: allergies, current medications, past family history, past medical history, past social history, past surgical history and problem list. Problem list updated.  Objective  Blood pressure 118/74, weight 192 lb (87.1 kg), last menstrual period 08/26/2020. Pregravid weight 150 lb (68 kg) Total Weight Gain 42 lb (19.1 kg) Urinalysis: Urine Protein    Urine Glucose    Fetal Status:     Movement: Present     General:  Alert, oriented and cooperative. Patient is in no acute distress.  Skin: Skin is warm and dry. No rash noted.   Cardiovascular: Normal heart rate noted  Respiratory: Normal respiratory effort, no problems with respiration noted  Abdomen: Soft, gravid, appropriate for gestational age. Pain/Pressure: Present     Pelvic:  Cervical exam deferred        Extremities: Normal range of motion.     Mental Status: Normal mood and affect. Normal behavior. Normal judgment and thought content.   Assessment   20 y.o. G1P0 at [redacted]w[redacted]d by  06/02/2021, by Last Menstrual Period presenting for routine prenatal visit  Plan    Pregnancy #1 Problems (from 10/13/20 to present)    Problem Noted Resolved   Anemia in pregnancy, third trimester 03/17/2021 by [name], CNM No   Overview Signed 03/17/2021 10:14 PM by [name], CNM    H and H at 28 weeks : 8.5/26.8 Iron advised, and will plan to recheck her at [redacted] weeks gestation.      Encounter for supervision of normal first pregnancy in first trimester 10/13/2020 by Zipporah Plants, CNM No   Overview Addendum 04/13/2021 11:29 AM by Ellwood Sayers, CNM     Nursing Staff Provider  Office Location  [facility] Dating   LMP=9week [place]  Language  English Anatomy US  Completed 8/29  Flu Vaccine   declined Genetic Screen  NIPS: normal XX  TDaP vaccine   declined Hgb A1C or  GTT Early : n/a Third trimester : 79  Rhogam   n/a   LAB RESULTS   Feeding Plan  breast Blood Type   A+  Contraception  Antibody   Negative  Circumcision  Rubella   Immune  Pediatrician   RPR   Non-reactive  Support Person  boyfriend HBsAg   Negative  Prenatal Classes  resources provided at [facility] HIV   Non-reactive    Varicella   Immune  BTL Consent  n/a GBS  (For PCN allergy, check sensitivities)        VBAC Consent  n/a Pap  collected 10/13/20    Hgb Electro   Normal hgb  Covid-19  Unvaccinated CF  considering     SMA  considering                  Preterm labor symptoms and general obstetric precautions including  but not limited to vaginal bleeding, contractions, leaking of fluid and fetal movement were reviewed in detail with the patient. Please refer to After Visit Summary for other counseling recommendations.  To continue taking iron, and will start iron infusions  Return in about 2 weeks (around 05/11/2021) for return OB, GBS.  [name], CNM  04/27/2021 11:20 AM

## 2021-04-29 ENCOUNTER — Encounter: Payer: Self-pay | Admitting: Internal Medicine

## 2021-04-29 ENCOUNTER — Inpatient Hospital Stay: Payer: BC Managed Care – PPO | Attending: Internal Medicine | Admitting: Internal Medicine

## 2021-04-29 ENCOUNTER — Inpatient Hospital Stay: Payer: BC Managed Care – PPO

## 2021-04-29 ENCOUNTER — Other Ambulatory Visit: Payer: Self-pay

## 2021-04-29 DIAGNOSIS — D649 Anemia, unspecified: Secondary | ICD-10-CM | POA: Insufficient documentation

## 2021-04-29 DIAGNOSIS — O99013 Anemia complicating pregnancy, third trimester: Secondary | ICD-10-CM | POA: Diagnosis not present

## 2021-04-29 DIAGNOSIS — Z803 Family history of malignant neoplasm of breast: Secondary | ICD-10-CM | POA: Insufficient documentation

## 2021-04-29 DIAGNOSIS — Z809 Family history of malignant neoplasm, unspecified: Secondary | ICD-10-CM | POA: Diagnosis not present

## 2021-04-29 DIAGNOSIS — E611 Iron deficiency: Secondary | ICD-10-CM | POA: Diagnosis not present

## 2021-04-29 DIAGNOSIS — R5383 Other fatigue: Secondary | ICD-10-CM | POA: Insufficient documentation

## 2021-04-29 DIAGNOSIS — R5381 Other malaise: Secondary | ICD-10-CM | POA: Diagnosis not present

## 2021-04-29 DIAGNOSIS — Z800 Family history of malignant neoplasm of digestive organs: Secondary | ICD-10-CM | POA: Diagnosis not present

## 2021-04-29 DIAGNOSIS — K5900 Constipation, unspecified: Secondary | ICD-10-CM | POA: Insufficient documentation

## 2021-04-29 LAB — CBC WITH DIFFERENTIAL/PLATELET
Abs Immature Granulocytes: 0.04 K/uL (ref 0.00–0.07)
Basophils Absolute: 0 K/uL (ref 0.0–0.1)
Basophils Relative: 1 %
Eosinophils Absolute: 0.1 K/uL (ref 0.0–0.5)
Eosinophils Relative: 1 %
HCT: 30.4 % — ABNORMAL LOW (ref 36.0–46.0)
Hemoglobin: 9.4 g/dL — ABNORMAL LOW (ref 12.0–15.0)
Immature Granulocytes: 1 %
Lymphocytes Relative: 28 %
Lymphs Abs: 1.4 K/uL (ref 0.7–4.0)
MCH: 24.8 pg — ABNORMAL LOW (ref 26.0–34.0)
MCHC: 30.9 g/dL (ref 30.0–36.0)
MCV: 80.2 fL (ref 80.0–100.0)
Monocytes Absolute: 0.3 K/uL (ref 0.1–1.0)
Monocytes Relative: 6 %
Neutro Abs: 3.2 K/uL (ref 1.7–7.7)
Neutrophils Relative %: 63 %
Platelets: 271 K/uL (ref 150–400)
RBC: 3.79 MIL/uL — ABNORMAL LOW (ref 3.87–5.11)
RDW: 14.4 % (ref 11.5–15.5)
WBC: 5 K/uL (ref 4.0–10.5)
nRBC: 0 % (ref 0.0–0.2)

## 2021-04-29 LAB — COMPREHENSIVE METABOLIC PANEL
ALT: 11 U/L (ref 0–44)
AST: 17 U/L (ref 15–41)
Albumin: 2.8 g/dL — ABNORMAL LOW (ref 3.5–5.0)
Alkaline Phosphatase: 145 U/L — ABNORMAL HIGH (ref 38–126)
Anion gap: 9 (ref 5–15)
BUN: <5 mg/dL — ABNORMAL LOW (ref 6–20)
CO2: 23 mmol/L (ref 22–32)
Calcium: 8.9 mg/dL (ref 8.9–10.3)
Chloride: 102 mmol/L (ref 98–111)
Creatinine, Ser: 0.56 mg/dL (ref 0.44–1.00)
GFR, Estimated: >60 mL/min (ref >60)
Glucose, Bld: 115 mg/dL — ABNORMAL HIGH (ref 70–99)
Potassium: 3.8 mmol/L (ref 3.5–5.1)
Sodium: 134 mmol/L — ABNORMAL LOW (ref 135–145)
Total Bilirubin: 0.7 mg/dL (ref 0.3–1.2)
Total Protein: 6.2 g/dL — ABNORMAL LOW (ref 6.5–8.1)

## 2021-04-29 LAB — IRON AND TIBC
Iron: 89 ug/dL (ref 28–170)
Saturation Ratios: 17 % (ref 10.4–31.8)
TIBC: 525 ug/dL — ABNORMAL HIGH (ref 250–450)
UIBC: 436 ug/dL

## 2021-04-29 LAB — RETICULOCYTES
Immature Retic Fract: 40.1 % — ABNORMAL HIGH (ref 2.3–15.9)
RBC.: 3.82 MIL/uL — ABNORMAL LOW (ref 3.87–5.11)
Retic Count, Absolute: 70.3 10*3/uL (ref 19.0–186.0)
Retic Ct Pct: 1.8 % (ref 0.4–3.1)

## 2021-04-29 LAB — VITAMIN B12: Vitamin B-12: 299 pg/mL (ref 180–914)

## 2021-04-29 LAB — LACTATE DEHYDROGENASE: LDH: 122 U/L (ref 98–192)

## 2021-04-29 LAB — FERRITIN: Ferritin: 10 ng/mL — ABNORMAL LOW (ref 11–307)

## 2021-04-29 NOTE — Progress Notes (Signed)
New pt referred by Dr Marton Redwood for anemia affecting pregnancy. Pt is 35 weeks. Due first week of January.

## 2021-04-29 NOTE — Assessment & Plan Note (Addendum)
#  Anemia hemoglobin 8.8 [November 2022-OB]-symptomatic with worsening fatigue.  Patient currently on p.o. iron supplementation/prenatal.  Continue prenatal vitamin for now.  If iron stores significantly low will recommend IV iron infusion. Discussed the potential acute infusion reactions with IV iron; which are quite rare.  Patient understands the risk; will proceed with infusions if needed.   #Etiology: Likely dilutional/third trimester pregnancy and also suspected iron deficiency.  Recommend checking CBC CMP LDH iron studies ferritin B12.   Thank you for allowing me to participate in the care of your pleasant patient. Please do not hesitate to contact me with questions or concerns in the interim.  # DISPOSITION: # labs- today- ordered # venofer 2 times a week x 4 infusion # follow up with NP- end of JAN 2023; labs- cbc/possible venofer-Dr.B

## 2021-04-29 NOTE — Progress Notes (Signed)
[facility] CONSULT NOTE  Patient Care Team: Ronnette Juniper, MD as PCP - General (Pediatrics)  CHIEF COMPLAINTS/PURPOSE OF CONSULTATION: ANEMIA   HEMATOLOGY HISTORY:  # NOV 2022- MAY 2022- Hb 13; NOV 2022- ANEMIA hb-8.8 [ Dec third trimester]-   HISTORY OF PRESENTING ILLNESS: Ambulating independently. [place] 20 y.o.  female has been referred to [place] for further evaluation/work-up for anemia.  Patient is currently in third trimester pregnancy-awaiting delivery of the first baby in January 2023.  Patient has been on prenatal vitamins/gummy-since pregnancy.  This is patient's first pregnancy.  Blood in stools: None Change in bowel habits- None Blood in urine: None Difficulty swallowing: None Prior Blood transfusions: no  Bariatric surgery: None EGD/Colonoscopy:none  Vaginal bleeding: None  Review of Systems  Constitutional:  Positive for malaise/fatigue. Negative for chills, diaphoresis, fever and weight loss.  HENT:  Negative for nosebleeds and sore throat.   Eyes:  Negative for double vision.  Respiratory:  Negative for cough, hemoptysis, sputum production, shortness of breath and wheezing.   Cardiovascular:  Negative for chest pain, palpitations, orthopnea and leg swelling.  Gastrointestinal:  Positive for constipation. Negative for abdominal pain, blood in stool, diarrhea, heartburn, melena, nausea and vomiting.  Genitourinary:  Negative for dysuria, frequency and urgency.  Musculoskeletal:  Negative for back pain and joint pain.  Skin: Negative.  Negative for itching and rash.  Neurological:  Negative for dizziness, tingling, focal weakness, weakness and headaches.  Endo/Heme/Allergies:  Does not bruise/bleed easily.  Psychiatric/Behavioral:  Negative for depression. The patient is not nervous/anxious and does not have insomnia.    MEDICAL HISTORY:  Past Medical History:  Diagnosis Date   Asthma    DR. PRINGLE   Heart murmur    INNOCENT     SURGICAL HISTORY: Past Surgical History:  Procedure Laterality Date   NO PAST SURGERIES      SOCIAL HISTORY: Social History   Socioeconomic History   Marital status: Single    Spouse name: Not on file   Number of children: 0   Years of education: 12   Highest education level: Not on file  Occupational History   Occupation: STUDENT    Comment: [employer]  Tobacco Use   Smoking status: Never   Smokeless tobacco: Never  Vaping Use   Vaping Use: Former  Substance and Sexual Activity   Alcohol use: Not Currently   Drug use: Yes    Types: Marijuana   Sexual activity: Yes    Birth control/protection: None  Other Topics Concern   Not on file  Social History Narrative   Lives in [place]; not smoking now; no alcohol; 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   Social Determinants of Health   Financial Resource Strain: Not on file  Food Insecurity: Not on file  Transportation Needs: Not on file  Physical Activity: Not on file  Stress: Not on file  Social Connections: Not on file  Intimate Partner Violence: Not on file    FAMILY HISTORY: Family History  Problem Relation Age of Onset   Hypertension Maternal Aunt    Cancer Maternal Uncle 60   Hypertension Maternal Grandmother    Cancer Maternal Grandmother 26   Breast cancer Other 60   Cancer Other 90       COLON    ALLERGIES:  has No Known Allergies.  MEDICATIONS:  Current Outpatient Medications  Medication Sig Dispense Refill   Prenatal Vit-Iron Carbonyl-FA (PRENATABS RX) 29-1 MG TABS Take 1 tablet by mouth  daily. 60 tablet 4   albuterol (PROVENTIL HFA;VENTOLIN HFA) 108 (90 Base) MCG/ACT inhaler Inhale into the lungs every 6 (six) hours as needed for wheezing or shortness of breath. (Patient not taking: Reported on 04/29/2021)     No current facility-administered medications for this visit.      PHYSICAL EXAMINATION:   Vitals:   04/29/21 1132  BP: 113/70  Pulse: (!) 104  Resp: 14   Temp: 97.9 F (36.6 C)  SpO2: 100%   Filed Weights   04/29/21 1132  Weight: 190 lb (86.2 kg)    Physical Exam Vitals and nursing note reviewed.  HENT:     Head: Normocephalic and atraumatic.     Mouth/Throat:     Pharynx: Oropharynx is clear.  Eyes:     Extraocular Movements: Extraocular movements intact.     Pupils: Pupils are equal, round, and reactive to light.  Cardiovascular:     Rate and Rhythm: Normal rate and regular rhythm.  Pulmonary:     Comments: Decreased breath sounds bilaterally.  Abdominal:     Palpations: Abdomen is soft.  Musculoskeletal:        General: Normal range of motion.     Cervical back: Normal range of motion.  Skin:    General: Skin is warm.  Neurological:     General: No focal deficit present.     Mental Status: She is alert and oriented to person, place, and time.  Psychiatric:        Behavior: Behavior normal.        Judgment: Judgment normal.    LABORATORY DATA:  I have reviewed the data as listed Lab Results  Component Value Date   WBC 5.0 04/29/2021   HGB 9.4 (L) 04/29/2021   HCT 30.4 (L) 04/29/2021   MCV 80.2 04/29/2021   PLT 271 04/29/2021   Recent Labs    04/29/21 1147  NA 134*  K 3.8  CL 102  CO2 23  GLUCOSE 115*  BUN <5*  CREATININE 0.56  CALCIUM 8.9  GFRNONAA >60  PROT 6.2*  ALBUMIN 2.8*  AST 17  ALT 11  ALKPHOS 145*  BILITOT 0.7     No results found.  Symptomatic anemia #Anemia hemoglobin 8.8 [November 2022-OB]-symptomatic with worsening fatigue.  Patient currently on p.o. iron supplementation/prenatal.  Continue prenatal vitamin for now.  If iron stores significantly low will recommend IV iron infusion. Discussed the potential acute infusion reactions with IV iron; which are quite rare.  Patient understands the risk; will proceed with infusions if needed.   #Etiology: Likely dilutional/third trimester pregnancy and also suspected iron deficiency.  Recommend checking CBC CMP LDH iron studies ferritin  B12.   Thank you for allowing me to participate in the care of your pleasant patient. Please do not hesitate to contact me with questions or concerns in the interim.  # DISPOSITION: # labs- today- ordered # venofer 2 times a week x 4 infusion # follow up with NP- end of JAN 2023; labs- cbc/possible venofer-Dr.B     All questions were answered. The patient knows to call the clinic with any problems, questions or concerns.      Earna Coder, MD 04/29/2021 1:15 PM

## 2021-04-30 LAB — HAPTOGLOBIN: Haptoglobin: 102 mg/dL (ref 33–278)

## 2021-05-03 ENCOUNTER — Inpatient Hospital Stay: Payer: BC Managed Care – PPO

## 2021-05-03 ENCOUNTER — Other Ambulatory Visit: Payer: Self-pay

## 2021-05-03 VITALS — BP 112/69 | HR 78 | Temp 98.6°F | Resp 18

## 2021-05-03 DIAGNOSIS — Z809 Family history of malignant neoplasm, unspecified: Secondary | ICD-10-CM | POA: Diagnosis not present

## 2021-05-03 DIAGNOSIS — E611 Iron deficiency: Secondary | ICD-10-CM | POA: Diagnosis not present

## 2021-05-03 DIAGNOSIS — D649 Anemia, unspecified: Secondary | ICD-10-CM | POA: Diagnosis not present

## 2021-05-03 DIAGNOSIS — Z803 Family history of malignant neoplasm of breast: Secondary | ICD-10-CM | POA: Diagnosis not present

## 2021-05-03 DIAGNOSIS — R5383 Other fatigue: Secondary | ICD-10-CM | POA: Diagnosis not present

## 2021-05-03 DIAGNOSIS — O99013 Anemia complicating pregnancy, third trimester: Secondary | ICD-10-CM | POA: Diagnosis not present

## 2021-05-03 DIAGNOSIS — Z800 Family history of malignant neoplasm of digestive organs: Secondary | ICD-10-CM | POA: Diagnosis not present

## 2021-05-03 DIAGNOSIS — K5900 Constipation, unspecified: Secondary | ICD-10-CM | POA: Diagnosis not present

## 2021-05-03 DIAGNOSIS — R5381 Other malaise: Secondary | ICD-10-CM | POA: Diagnosis not present

## 2021-05-03 MED ORDER — SODIUM CHLORIDE 0.9 % IV SOLN: 200.0000 mg | Freq: Once | INTRAVENOUS | Status: DC

## 2021-05-03 MED ORDER — SODIUM CHLORIDE 0.9 % IV SOLN
Freq: Once | INTRAVENOUS | Status: AC
  Filled 2021-05-03: qty 250

## 2021-05-03 MED ORDER — IRON SUCROSE 20 MG/ML IV SOLN
200.0000 mg | Freq: Once | INTRAVENOUS | Status: AC
  Administered 2021-05-03: 200 mg via INTRAVENOUS
  Filled 2021-05-03: qty 10

## 2021-05-04 ENCOUNTER — Telehealth: Payer: Self-pay

## 2021-05-04 ENCOUNTER — Observation Stay
Admission: EM | Admit: 2021-05-04 | Discharge: 2021-05-04 | Disposition: A | Discharge status: Home / Self Care | Payer: BC Managed Care – PPO | Attending: Obstetrics and Gynecology | Admitting: Obstetrics and Gynecology

## 2021-05-04 ENCOUNTER — Encounter: Payer: Self-pay | Admitting: Obstetrics and Gynecology

## 2021-05-04 ENCOUNTER — Other Ambulatory Visit: Payer: Self-pay

## 2021-05-04 DIAGNOSIS — Z3A36 36 weeks gestation of pregnancy: Secondary | ICD-10-CM | POA: Diagnosis not present

## 2021-05-04 DIAGNOSIS — O26893 Other specified pregnancy related conditions, third trimester: Secondary | ICD-10-CM | POA: Diagnosis not present

## 2021-05-04 DIAGNOSIS — O4290 Premature rupture of membranes, unspecified as to length of time between rupture and onset of labor, unspecified weeks of gestation: Secondary | ICD-10-CM | POA: Diagnosis present

## 2021-05-04 DIAGNOSIS — O99013 Anemia complicating pregnancy, third trimester: Secondary | ICD-10-CM

## 2021-05-04 DIAGNOSIS — O42913 Preterm premature rupture of membranes, unspecified as to length of time between rupture and onset of labor, third trimester: Principal | ICD-10-CM | POA: Insufficient documentation

## 2021-05-04 DIAGNOSIS — Z3401 Encounter for supervision of normal first pregnancy, first trimester: Secondary | ICD-10-CM

## 2021-05-04 HISTORY — DX: Premature rupture of membranes, unspecified as to length of time between rupture and onset of labor, unspecified weeks of gestation: O42.90

## 2021-05-04 LAB — RUPTURE OF MEMBRANE (ROM)PLUS: Rom Plus: NEGATIVE

## 2021-05-04 NOTE — OB Triage Note (Signed)
Pt reports being moist in the vaginal area and she doesn't feel like she is peeing on herself. No vaginal bleeding, baby active, G1P0

## 2021-05-04 NOTE — Discharge Summary (Signed)
Physician Discharge Summary   Patient ID: Kaitlyn Galloway [id number] 20 y.o. 07/29/2000  Admit date: 05/04/2021  Discharge date and time: No discharge date for patient encounter.   Admitting Physician: Natale Milch, MD   Discharge Physician: Adelene Idler MD  Admission Diagnoses: Leakage of amniotic fluid [O42.90]  Discharge Diagnoses: No leakage of amniotic fluid  Admission Condition: good  Discharged Condition: good  Indication for Admission: Evaluation of leakage of fluid  Hospital Course: patient presented for evaluation of PROM. She noted a gush of fluid while at home this morning. Since then she has not had continued leakage of fluid. She is feeling mild irregular contractions and reports this has been going on for several days. No other complaints. Reports normal fetal movement. ROM plus was negative. Reactive fetal tracing. SVE per nursing 1/50/-3- Was able to be discharged home in stable condition. Follow up planned for Monday at [facility] visit.    Consults: None  Significant Diagnostic Studies: Rom plus negative  Treatments: none  Discharge Exam: BP 126/74 (BP Location: Right Arm)    Pulse 88    Temp 98 F (36.7 C) (Oral)    Resp 18    LMP 08/26/2020 (Approximate)   General Appearance:    Alert, cooperative, no distress, appears stated age  Head:    Normocephalic, without obvious abnormality, atraumatic  Eyes:    PERRL, conjunctiva/corneas clear, EOM's intact, fundi    benign, both eyes  Ears:    Normal TM's and external ear canals, both ears  Nose:   Nares normal, septum midline, mucosa normal, no drainage    or sinus tenderness  Throat:   Lips, mucosa, and tongue normal; teeth and gums normal  Neck:   Supple, symmetrical, trachea midline, no adenopathy;    thyroid:  no enlargement/tenderness/nodules; no carotid   bruit or JVD  Back:     Symmetric, no curvature, ROM normal, no CVA tenderness  Lungs:     Clear to auscultation bilaterally, respirations  unlabored  Chest Wall:    No tenderness or deformity   Heart:    Regular rate and rhythm, S1 and S2 normal, no murmur, rub   or gallop  Breast Exam:    No tenderness, masses, or nipple abnormality  Abdomen:     Soft, non-tender, bowel sounds active all four quadrants,    no masses, no organomegaly  Genitalia:    Normal female without lesion, discharge or tenderness  Rectal:    Normal tone, normal prostate, no masses or tenderness;   guaiac negative stool  Extremities:   Extremities normal, atraumatic, no cyanosis or edema  Pulses:   2+ and symmetric all extremities  Skin:   Skin color, texture, turgor normal, no rashes or lesions  Lymph nodes:   Cervical, supraclavicular, and axillary nodes normal  Neurologic:   CNII-XII intact, normal strength, sensation and reflexes    throughout    Disposition: Discharge disposition: 01-Home or Self Care       Patient Instructions:  Allergies as of 05/04/2021   No Known Allergies      Medication List     TAKE these medications    albuterol 108 (90 Base) MCG/ACT inhaler Commonly known as: VENTOLIN HFA Inhale into the lungs every 6 (six) hours as needed for wheezing or shortness of breath.   Prenatabs Rx 29-1 MG Tabs Take 1 tablet by mouth daily.       Activity: activity as tolerated Diet: regular diet Wound Care: none needed  Follow-up  with [facility] next week as planned.   Signed: Natale Milch 05/04/2021 1:52 PM

## 2021-05-04 NOTE — Discharge Summary (Signed)
RN reviewed discharge instructions with patient. Gave patient opportunity for questions. All questions answered at this time. Pt verbalized understanding. Pt discharged home with her mother. 

## 2021-05-04 NOTE — Telephone Encounter (Signed)
Pt's mom, Judeth Cornfield, calling; pt woke up wet this morning; and continues to leak fluid; per PH to go to L&D.  Camille notified.

## 2021-05-05 ENCOUNTER — Inpatient Hospital Stay: Payer: BC Managed Care – PPO

## 2021-05-05 VITALS — BP 106/66 | HR 61 | Temp 98.2°F | Resp 18

## 2021-05-05 DIAGNOSIS — Z800 Family history of malignant neoplasm of digestive organs: Secondary | ICD-10-CM | POA: Diagnosis not present

## 2021-05-05 DIAGNOSIS — R5381 Other malaise: Secondary | ICD-10-CM | POA: Diagnosis not present

## 2021-05-05 DIAGNOSIS — Z809 Family history of malignant neoplasm, unspecified: Secondary | ICD-10-CM | POA: Diagnosis not present

## 2021-05-05 DIAGNOSIS — E611 Iron deficiency: Secondary | ICD-10-CM | POA: Diagnosis not present

## 2021-05-05 DIAGNOSIS — R5383 Other fatigue: Secondary | ICD-10-CM | POA: Diagnosis not present

## 2021-05-05 DIAGNOSIS — Z803 Family history of malignant neoplasm of breast: Secondary | ICD-10-CM | POA: Diagnosis not present

## 2021-05-05 DIAGNOSIS — D649 Anemia, unspecified: Secondary | ICD-10-CM | POA: Diagnosis not present

## 2021-05-05 DIAGNOSIS — O99013 Anemia complicating pregnancy, third trimester: Secondary | ICD-10-CM | POA: Diagnosis not present

## 2021-05-05 DIAGNOSIS — K5900 Constipation, unspecified: Secondary | ICD-10-CM | POA: Diagnosis not present

## 2021-05-05 MED ORDER — SODIUM CHLORIDE 0.9 % IV SOLN: 200.0000 mg | Freq: Once | INTRAVENOUS | Status: DC

## 2021-05-05 MED ORDER — IRON SUCROSE 20 MG/ML IV SOLN
200.0000 mg | Freq: Once | INTRAVENOUS | Status: AC
  Administered 2021-05-05: 14:00:00 200 mg via INTRAVENOUS
  Filled 2021-05-05: qty 10

## 2021-05-05 MED ORDER — SODIUM CHLORIDE 0.9 % IV SOLN
Freq: Once | INTRAVENOUS | Status: AC
  Filled 2021-05-05: qty 250

## 2021-05-05 NOTE — Patient Instructions (Signed)

## 2021-05-10 ENCOUNTER — Inpatient Hospital Stay: Payer: BC Managed Care – PPO

## 2021-05-10 ENCOUNTER — Ambulatory Visit (INDEPENDENT_AMBULATORY_CARE_PROVIDER_SITE_OTHER): Payer: BC Managed Care – PPO | Admitting: Obstetrics

## 2021-05-10 ENCOUNTER — Other Ambulatory Visit: Payer: Self-pay

## 2021-05-10 ENCOUNTER — Encounter: Payer: Self-pay | Admitting: Obstetrics

## 2021-05-10 VITALS — BP 113/68 | HR 66 | Temp 99.3°F | Resp 18

## 2021-05-10 VITALS — BP 122/74 | Wt 194.0 lb

## 2021-05-10 DIAGNOSIS — K5900 Constipation, unspecified: Secondary | ICD-10-CM | POA: Diagnosis not present

## 2021-05-10 DIAGNOSIS — R5383 Other fatigue: Secondary | ICD-10-CM | POA: Diagnosis not present

## 2021-05-10 DIAGNOSIS — Z3A36 36 weeks gestation of pregnancy: Secondary | ICD-10-CM

## 2021-05-10 DIAGNOSIS — Z800 Family history of malignant neoplasm of digestive organs: Secondary | ICD-10-CM | POA: Diagnosis not present

## 2021-05-10 DIAGNOSIS — Z803 Family history of malignant neoplasm of breast: Secondary | ICD-10-CM | POA: Diagnosis not present

## 2021-05-10 DIAGNOSIS — O99013 Anemia complicating pregnancy, third trimester: Secondary | ICD-10-CM | POA: Diagnosis not present

## 2021-05-10 DIAGNOSIS — Z809 Family history of malignant neoplasm, unspecified: Secondary | ICD-10-CM | POA: Diagnosis not present

## 2021-05-10 DIAGNOSIS — R5381 Other malaise: Secondary | ICD-10-CM | POA: Diagnosis not present

## 2021-05-10 DIAGNOSIS — Z3400 Encounter for supervision of normal first pregnancy, unspecified trimester: Secondary | ICD-10-CM

## 2021-05-10 DIAGNOSIS — D649 Anemia, unspecified: Secondary | ICD-10-CM

## 2021-05-10 DIAGNOSIS — E611 Iron deficiency: Secondary | ICD-10-CM | POA: Diagnosis not present

## 2021-05-10 MED ORDER — SODIUM CHLORIDE 0.9 % IV SOLN: 200.0000 mg | Freq: Once | INTRAVENOUS | Status: DC

## 2021-05-10 MED ORDER — SODIUM CHLORIDE 0.9 % IV SOLN
Freq: Once | INTRAVENOUS | Status: AC
  Filled 2021-05-10: qty 250

## 2021-05-10 MED ORDER — IRON SUCROSE 20 MG/ML IV SOLN
200.0000 mg | Freq: Once | INTRAVENOUS | Status: AC
  Administered 2021-05-10: 14:00:00 200 mg via INTRAVENOUS

## 2021-05-10 NOTE — Progress Notes (Signed)
GBS today. No vb. No lof.  

## 2021-05-10 NOTE — Progress Notes (Signed)
Routine Prenatal Care Visit  Subjective  Kaitlyn Galloway is a 20 y.o. G1P0 at [redacted]w[redacted]d being seen today for ongoing prenatal care.  She is currently monitored for the following issues for this low-risk pregnancy and has Asthma, well controlled; Encounter for supervision of normal first pregnancy in first trimester; Nausea/vomiting in pregnancy; Anemia in pregnancy, third trimester; and Symptomatic anemia on their problem list.  ----------------------------------------------------------------------------------- Patient reports no complaints.  She was seen at the hospital recently for suspected ROM- likely urinated on herself. Contractions: Not present. Vag. Bleeding: None.  Movement: Present. Leaking Fluid denies.  ----------------------------------------------------------------------------------- The following portions of the patient's history were reviewed and updated as appropriate: allergies, current medications, past family history, past medical history, past social history, past surgical history and problem list. Problem list updated.  Objective  Blood pressure 122/74, weight 194 lb (88 kg), last menstrual period 08/26/2020. Pregravid weight 150 lb (68 kg) Total Weight Gain 44 lb (20 kg) Urinalysis: Urine Protein    Urine Glucose    Fetal Status:     Movement: Present     General:  Alert, oriented and cooperative. Patient is in no acute distress.  Skin: Skin is warm and dry. No rash noted.   Cardiovascular: Normal heart rate noted  Respiratory: Normal respiratory effort, no problems with respiration noted  Abdomen: Soft, gravid, appropriate for gestational age. Pain/Pressure: Absent     Pelvic:  Cervical exam performed        Extremities: Normal range of motion.     Mental Status: Normal mood and affect. Normal behavior. Normal judgment and thought content.   Assessment   20 y.o. G1P0 at [redacted]w[redacted]d by  06/02/2021, by Last Menstrual Period presenting for routine prenatal visit  Plan    Pregnancy #1 Problems (from 10/13/20 to present)    Problem Noted Resolved   Anemia in pregnancy, third trimester 03/17/2021 by [name], CNM No   Overview Signed 03/17/2021 10:14 PM by [name], CNM    H and H at 28 weeks : 8.5/26.8 Iron advised, and will plan to recheck her at [redacted] weeks gestation.      Encounter for supervision of normal first pregnancy in first trimester 10/13/2020 by Zipporah Plants, CNM No   Overview Addendum 04/13/2021 11:29 AM by Ellwood Sayers, CNM     Nursing Staff Provider  Office Location  [facility] Dating   LMP=9week [place]  Language  English Anatomy US  Completed 8/29  Flu Vaccine   declined Genetic Screen  NIPS: normal XX  TDaP vaccine   declined Hgb A1C or  GTT Early : n/a Third trimester : 79  Rhogam   n/a   LAB RESULTS   Feeding Plan  breast Blood Type   A+  Contraception  Antibody   Negative  Circumcision  Rubella   Immune  Pediatrician   RPR   Non-reactive  Support Person  boyfriend HBsAg   Negative  Prenatal Classes  resources provided at [facility] HIV   Non-reactive    Varicella   Immune  BTL Consent  n/a GBS  (For PCN allergy, check sensitivities)        VBAC Consent  n/a Pap  collected 10/13/20    Hgb Electro   Normal hgb  Covid-19  Unvaccinated CF  considering     SMA  considering                  Term labor symptoms and general obstetric precautions including but not limited  to vaginal bleeding, contractions, leaking of fluid and fetal movement were reviewed in detail with the patient. Please refer to After Visit Summary for other counseling recommendations.   Return in about 1 week (around 05/17/2021) for return OB.  [name], CNM  05/10/2021 9:31 AM

## 2021-05-11 ENCOUNTER — Other Ambulatory Visit (HOSPITAL_COMMUNITY)
Admission: RE | Admit: 2021-05-11 | Discharge: 2021-05-11 | Disposition: A | Discharge status: Home / Self Care | Payer: BC Managed Care – PPO | Source: Ambulatory Visit | Attending: Obstetrics | Admitting: Obstetrics

## 2021-05-11 DIAGNOSIS — Z3400 Encounter for supervision of normal first pregnancy, unspecified trimester: Secondary | ICD-10-CM | POA: Insufficient documentation

## 2021-05-11 NOTE — Addendum Note (Signed)
Addended by: [name] on: 05/11/2021 02:20 PM   Modules accepted: Orders

## 2021-05-12 ENCOUNTER — Inpatient Hospital Stay: Payer: BC Managed Care – PPO

## 2021-05-12 ENCOUNTER — Other Ambulatory Visit: Payer: Self-pay

## 2021-05-12 VITALS — BP 111/67 | HR 81 | Temp 99.0°F | Resp 18

## 2021-05-12 DIAGNOSIS — R5381 Other malaise: Secondary | ICD-10-CM | POA: Diagnosis not present

## 2021-05-12 DIAGNOSIS — E611 Iron deficiency: Secondary | ICD-10-CM | POA: Diagnosis not present

## 2021-05-12 DIAGNOSIS — Z809 Family history of malignant neoplasm, unspecified: Secondary | ICD-10-CM | POA: Diagnosis not present

## 2021-05-12 DIAGNOSIS — O99013 Anemia complicating pregnancy, third trimester: Secondary | ICD-10-CM | POA: Diagnosis not present

## 2021-05-12 DIAGNOSIS — Z803 Family history of malignant neoplasm of breast: Secondary | ICD-10-CM | POA: Diagnosis not present

## 2021-05-12 DIAGNOSIS — Z3400 Encounter for supervision of normal first pregnancy, unspecified trimester: Secondary | ICD-10-CM | POA: Diagnosis not present

## 2021-05-12 DIAGNOSIS — D649 Anemia, unspecified: Secondary | ICD-10-CM | POA: Diagnosis not present

## 2021-05-12 DIAGNOSIS — Z800 Family history of malignant neoplasm of digestive organs: Secondary | ICD-10-CM | POA: Diagnosis not present

## 2021-05-12 DIAGNOSIS — R5383 Other fatigue: Secondary | ICD-10-CM | POA: Diagnosis not present

## 2021-05-12 DIAGNOSIS — K5900 Constipation, unspecified: Secondary | ICD-10-CM | POA: Diagnosis not present

## 2021-05-12 MED ORDER — SODIUM CHLORIDE 0.9 % IV SOLN
Freq: Once | INTRAVENOUS | Status: AC
  Filled 2021-05-12: qty 250

## 2021-05-12 MED ORDER — IRON SUCROSE 20 MG/ML IV SOLN
200.0000 mg | Freq: Once | INTRAVENOUS | Status: AC
  Administered 2021-05-12: 14:00:00 200 mg via INTRAVENOUS
  Filled 2021-05-12: qty 10

## 2021-05-12 MED ORDER — SODIUM CHLORIDE 0.9 % IV SOLN: 200.0000 mg | Freq: Once | INTRAVENOUS | Status: DC

## 2021-05-13 LAB — CERVICOVAGINAL ANCILLARY ONLY
Chlamydia: NEGATIVE
Comment: NEGATIVE
Comment: NORMAL
Neisseria Gonorrhea: NEGATIVE

## 2021-05-14 ENCOUNTER — Ambulatory Visit (INDEPENDENT_AMBULATORY_CARE_PROVIDER_SITE_OTHER): Payer: BC Managed Care – PPO | Admitting: Obstetrics

## 2021-05-14 ENCOUNTER — Other Ambulatory Visit: Payer: Self-pay

## 2021-05-14 VITALS — BP 102/60 | Wt 193.0 lb

## 2021-05-14 DIAGNOSIS — Z3400 Encounter for supervision of normal first pregnancy, unspecified trimester: Secondary | ICD-10-CM

## 2021-05-14 DIAGNOSIS — Z3A37 37 weeks gestation of pregnancy: Secondary | ICD-10-CM

## 2021-05-14 LAB — POCT URINALYSIS DIPSTICK OB
Glucose, UA: NEGATIVE
POC,PROTEIN,UA: NEGATIVE

## 2021-05-14 NOTE — Progress Notes (Signed)
Routine Prenatal Care Visit  Subjective  Kaitlyn Galloway is a 20 y.o. G1P0 at [redacted]w[redacted]d being seen today for ongoing prenatal care.  She is currently monitored for the following issues for this low-risk pregnancy and has Asthma, well controlled; Encounter for supervision of normal first pregnancy in first trimester; Nausea/vomiting in pregnancy; Anemia in pregnancy, third trimester; and Symptomatic anemia on their problem list.  ----------------------------------------------------------------------------------- Patient reports no complaints.    .  .   [name] denies.  ----------------------------------------------------------------------------------- The following portions of the patient's history were reviewed and updated as appropriate: allergies, current medications, past family history, past medical history, past social history, past surgical history and problem list. Problem list updated.  Objective  Blood pressure 102/60, weight 193 lb (87.5 kg), last menstrual period 08/26/2020. Pregravid weight 150 lb (68 kg) Total Weight Gain 43 lb (19.5 kg) Urinalysis: Urine Protein    Urine Glucose    Fetal Status:           General:  Alert, oriented and cooperative. Patient is in no acute distress.  Skin: Skin is warm and dry. No rash noted.   Cardiovascular: Normal heart rate noted  Respiratory: Normal respiratory effort, no problems with respiration noted  Abdomen: Soft, gravid, appropriate for gestational age.       Pelvic:  Cervical exam performed      1.5/50/-3-2  Extremities: Normal range of motion.     Mental Status: Normal mood and affect. Normal behavior. Normal judgment and thought content.   Assessment   20 y.o. G1P0 at [redacted]w[redacted]d by  06/02/2021, by Last Menstrual Period presenting for routine prenatal visit  Plan   Pregnancy #1 Problems (from 10/13/20 to present)    Problem Noted Resolved   Anemia in pregnancy, third trimester 03/17/2021 by [name], CNM No    Overview Signed 03/17/2021 10:14 PM by [name], CNM    H and H at 28 weeks : 8.5/26.8 Iron advised, and will plan to recheck her at [redacted] weeks gestation.      Encounter for supervision of normal first pregnancy in first trimester 10/13/2020 by Zipporah Plants, CNM No   Overview Addendum 04/13/2021 11:29 AM by Ellwood Sayers, CNM     Nursing Staff Provider  Office Location  [facility] Dating   LMP=9week [place]  Language  English Anatomy US  Completed 8/29  Flu Vaccine   declined Genetic Screen  NIPS: normal XX  TDaP vaccine   declined Hgb A1C or  GTT Early : n/a Third trimester : 79  Rhogam   n/a   LAB RESULTS   Feeding Plan  breast Blood Type   A+  Contraception  Antibody   Negative  Circumcision  Rubella   Immune  Pediatrician   RPR   Non-reactive  Support Person  boyfriend HBsAg   Negative  Prenatal Classes  resources provided at [facility] HIV   Non-reactive    Varicella   Immune  BTL Consent  n/a GBS  (For PCN allergy, check sensitivities)        VBAC Consent  n/a Pap  collected 10/13/20    Hgb Electro   Normal hgb  Covid-19  Unvaccinated CF  considering     SMA  considering                  Term labor symptoms and general obstetric precautions including but not limited to vaginal bleeding, contractions, leaking of fluid and fetal movement were reviewed in detail with  the patient. Please refer to After Visit Summary for other counseling recommendations.  Still awaiting her GBS results. Will redo if they are not back next week.  Return in about 1 week (around 05/21/2021) for return OB.  [name], CNM  05/14/2021 9:21 AM

## 2021-05-14 NOTE — Progress Notes (Signed)
ROB- cervix check, no concerns 

## 2021-05-14 NOTE — Addendum Note (Signed)
Addended by: Donnetta Hail on: 05/14/2021 09:50 AM   Modules accepted: Orders

## 2021-05-16 LAB — CULTURE, BETA STREP (GROUP B ONLY): Strep Gp B Culture: NEGATIVE

## 2021-05-21 ENCOUNTER — Other Ambulatory Visit: Payer: Self-pay

## 2021-05-21 ENCOUNTER — Encounter: Payer: Self-pay | Admitting: Advanced Practice Midwife

## 2021-05-21 ENCOUNTER — Ambulatory Visit (INDEPENDENT_AMBULATORY_CARE_PROVIDER_SITE_OTHER): Payer: BC Managed Care – PPO | Admitting: Advanced Practice Midwife

## 2021-05-21 VITALS — BP 122/78 | Wt 194.0 lb

## 2021-05-21 DIAGNOSIS — Z3403 Encounter for supervision of normal first pregnancy, third trimester: Secondary | ICD-10-CM

## 2021-05-21 DIAGNOSIS — Z3A38 38 weeks gestation of pregnancy: Secondary | ICD-10-CM

## 2021-05-21 LAB — POCT URINALYSIS DIPSTICK OB
Glucose, UA: NEGATIVE
POC,PROTEIN,UA: NEGATIVE

## 2021-05-21 NOTE — Patient Instructions (Signed)
Second Stage of Labor Labor is your body's natural process of moving your baby and other structures, including the placenta and umbilical cord, out of your uterus. There are three stages of labor. How long each stage lasts is different for every person. However, certain events happen during each stage that are the same for everyone. The second stage begins when your cervix is fully dilated and you start pushing. This stage ends when your baby is born. The second stage of labor involves lots of pushing until your baby is delivered. This stage usually lasts from 20 minutes to 2 hours. How does this affect me? What happens in the second stage of labor? You will have strong contractions. They may last 45-90 seconds and come every 2-5 minutes. You may need to use your pain management strategies. You will feel a strong urge to push. Try to push during the contractions and relax between contractions. You can try to find a pushing position that works best for you. This may be lying back, squatting, kneeling, or sitting. You may feel a lot of pressure in your rectum and feel like you need to have a bowel movement. You might have a bowel movement or leak some urine. You may feel mentally exhausted and emotional. Rely on your birth support people for support and encouragement. Eventually, the top of your baby's head will be visible (crowning). You may be able to see this in a mirror. If your baby is in a normal position, your health care provider will help guide your baby out of your vagina. What happens if this stage of labor is not progressing? If your tissues do not stretch enough or if your baby is not making enough progress through the birth canal, certain procedures may be done to help deliver the baby. Your health care provider may cut the tissue between your vagina and rectum to widen the opening (episiotomy). Your health care provider may use a forceps or suctioning device to help pull your baby  through the birth canal (assisted birth). Your health care provider may recommend a cesarean delivery, or C-section. This is the surgical delivery of a baby through an incision in the abdomen and the uterus. How does this affect my baby? As the baby moves through the birth canal, his or her head will mold to fit. Your baby may be born with a cone-shaped head that will look normal within 24 hours. Moving through the birth canal helps to squeeze fluid out of your baby's lungs. This fluid is present because of practice breathing movements that the baby made while in your uterus. Your baby may have bruising or swelling on his or her face from passage through the birth canal. In rare cases, more serious birth injuries may occur. Summary The second stage of labor starts when your cervix is completely dilated, and it ends when your baby is born. The second stage involves lots of pushing until your baby is delivered. This stage usually lasts from 20 minutes to 2 hours. You may need to use your pain management strategies. During this stage, you will experience a strong urge to push, the crowning of your baby's head, and the delivery of your baby. If this stage is not progressing normally, you may need to have an episiotomy, an assisted delivery, or a cesarean delivery. This information is not intended to replace advice given to you by your health care provider. Make sure you discuss any questions you have with your health care provider. Document Revised:  09/22/2020 Document Reviewed: 09/22/2020 Elsevier Patient Education  2022 [employer]. First Stage of Labor Labor is your body's natural process of moving your baby and other structures, including the placenta and umbilical cord, out of your uterus. There are three stages of labor. How long each stage lasts is different for every person. However, certain events happen during each stage that are the same for everyone. The first stage starts when true labor  begins. This stage ends when your cervix, which is the opening from your uterus into your vagina, is completely open (dilated). Once your cervix is fully dilated you will be ready to enter the second stage of labor and start pushing. First stage of labor The first stage of labor begins when you start having regular painful contractions that come at evenly spaced intervals. During this stage, your cervix starts to get thinner and opens in preparation for your baby to pass through. Health care providers measure the dilation of your cervix in centimeters (cm). One centimeter is a little less than one-half of an inch. The first stage ends when your cervix is dilated to 10 cm. The first stage of labor is divided into three phases: Early phase. Active phase. Transitional phase. The length of the first stage of labor varies. It may be longer if this is your first pregnancy. You may spend some of this stage at home trying to relax and stay comfortable. How does this affect me? During the first stage of labor, you will move through three phases. What happens in the early phase? You will start to have regular contractions that last 30-60 seconds. Contractions may come every 5-20 minutes. Keep track of your contractions and call your health care provider. Your water may break (rupture of membranes). This is when the sac of fluid that surrounds your baby breaks. You may notice a clear or slightly bloody discharge of mucus (mucus plug) from your vagina. What happens in the active phase? The active phase usually lasts 3-5 hours. You may go to the hospital or birth center around this time. During the active phase: Your contractions will become stronger, longer, and more uncomfortable. Your contractions may last 45-90 seconds and come every 3-5 minutes. Your contractions will be painful. Most people feel abdominal pain with contractions, but you may also feel lower back pain. What happens in the transitional  phase? The transitional phase typically lasts from 30 minutes to 2 hours. At the end of this phase, your cervix will be fully dilated to 10 cm. During the transitional phase: Contractions will get stronger, longer, and more intense. Contractions may last 60-90 seconds and come less than 2 minutes apart. You may feel hot flashes, chills, or nausea. How does this affect my baby? During the first stage of labor, your baby will gradually move down into your birth canal. Follow these instructions at home:  When labor first begins, try to stay calm. You are still in the early phase. During this time it's important to rest and stay hydrated. You may want to make some calls and get ready to go to the hospital or birth center. When you are in the early phase, try these methods to help ease discomfort: Deep breathing and muscle relaxation. Taking a walk. Taking a warm bath or shower. Drink some fluids and have a light snack if you feel like it. Keep track of your contractions. Based on the plan you created with your health care provider, call when your contractions indicate it is time. If  your water breaks, note the time, color, and odor of the fluid. Contact a health care provider if: Your contractions are strong and regular. You have lower back pain or cramping. Your water breaks. You lose your mucus plug. Get help right away if: You have a severe headache that does not go away. You have changes in your vision. You have severe pain in your upper belly. You do not feel the baby move. You have bright red bleeding. Summary The first stage of labor starts when true labor begins, and it ends when your cervix is dilated to 10 cm. The first stage of labor has three phases: early, active, and transitional. Your baby moves into the birth canal during the first stage of labor. Call your health care provider if you think your labor is starting. This information is not intended to replace advice given  to you by your health care provider. Make sure you discuss any questions you have with your health care provider. Document Revised: 09/22/2020 Document Reviewed: 09/22/2020 Elsevier Patient Education  2022 [employer] Vaginal Delivery Vaginal delivery means that you give birth by pushing your baby out of your birth canal (vagina). Your health care team will help you before, during, and after vaginal delivery. Birth experiences are unique for every woman and every pregnancy, and birth experiences vary depending on where you choose to give birth. What are the risks and benefits? Generally, this is safe. However, problems may occur, including: Bleeding. Infection. Damage to other structures such as vaginal tearing. Allergic reactions to medicines. Despite the risks, benefits of vaginal delivery include less risk of bleeding and infection and a shorter recovery time compared to a Cesarean delivery. Cesarean delivery, or C-section, is the surgical delivery of a baby. What happens when I arrive at the birth center or hospital? Once you are in labor and have been admitted into the hospital or birth center, your health care team may: Review your pregnancy history and any concerns that you have. Talk with you about your birth plan and discuss pain control options. Check your blood pressure, breathing, and heartbeat. Assess your baby's heartbeat. Monitor your uterus for contractions. Check whether your bag of water (amniotic sac) has broken (ruptured). Insert an IV into one of your veins. This may be used to give you fluids and medicines. Monitoring Your health care team may assess your contractions (uterine monitoring) and your baby's heart rate (fetal monitoring). You may need to be monitored: Often, but not continuously (intermittently). All the time or for long periods at a time (continuously). Continuous monitoring may be needed if: You are taking certain medicines, such as medicine to  relieve pain or make your contractions stronger. You have pregnancy or labor complications. Monitoring may be done by: Placing a special stethoscope or a handheld monitoring device on your abdomen to check your baby's heartbeat and to check for contractions. Placing monitors on your abdomen (external monitors) to record your baby's heartbeat and the frequency and length of contractions. Placing monitors inside your uterus through your vagina (internal monitors) to record your baby's heartbeat and the frequency, length, and strength of your contractions. Depending on the type of monitor, it may remain in your uterus or on your baby's head until birth. Telemetry. This is a type of continuous monitoring that can be done with external or internal monitors. Instead of having to stay in bed, you are able to move around. Physical exam Your health care team may perform frequent physical exams. This may include:  Checking how and where your baby is positioned in your uterus. Checking your cervix to determine: Whether it is thinning out (effacing). Whether it is opening up (dilating). What happens during labor and delivery? Normal labor and delivery is divided into the following three stages: Stage 1 This is the longest stage of labor. Throughout this stage, you will feel contractions. Contractions generally feel mild, infrequent, and irregular at first. They get stronger, more frequent, and more regular as you move through this stage. You may have contractions about every 2-3 minutes. This stage ends when your cervix is completely dilated to 4 inches (10 cm) and completely effaced. Stage 2 This stage starts once your cervix is completely effaced and dilated and lasts until the delivery of your baby. This is the stage where you will feel an urge to push your baby out of your vagina. You may feel stretching and burning pain, especially when the widest part of your baby's head passes through the vaginal  opening (crowning). Once your baby is delivered, the umbilical cord will be clamped and cut. Timing of cutting the cord will depend on your wishes, your baby's health, and your health care provider's practices. Your baby will be placed on your bare chest (skin-to-skin contact) in an upright position and covered with a warm blanket. If you are choosing to breastfeed, watch your baby for feeding cues, like rooting or sucking, and help the baby to your breast for his or her first feeding. Stage 3 This stage starts immediately after the birth of your baby and ends after you deliver the placenta. This stage may take anywhere from 5 to 30 minutes. After your baby has been delivered, you will feel contractions as your body expels the placenta. These contractions also help your uterus get smaller and reduce bleeding. What can I expect after labor and delivery? After labor is over, you and your baby will be assessed closely until you are ready to go home. Your health care team will teach you how to care for yourself and your baby. You and your baby may be encouraged to stay in the same room (rooming in) during your hospital stay. This will help promote early bonding and successful breastfeeding. Your uterus will be checked and massaged regularly (fundal massage). You may continue to receive fluids and medicines through an IV. You will have some soreness and pain in your abdomen, vagina, and the area of skin between your vaginal opening and your anus (perineum). If an incision was made near your vagina (episiotomy) or if you had some vaginal tearing during delivery, cold compresses may be placed on your episiotomy or your tear. This helps to reduce pain and swelling. It is normal to have vaginal bleeding after delivery. Wear a sanitary pad for vaginal bleeding and discharge. Summary Vaginal delivery means that you will give birth by pushing your baby out of your birth canal (vagina). Your health care team  will monitor you and your baby throughout the stages of labor. After you deliver your baby, your health care team will continue to assess you and your baby to ensure you are both recovering as expected after delivery. This information is not intended to replace advice given to you by your health care provider. Make sure you discuss any questions you have with your health care provider. Document Revised: 04/06/2020 Document Reviewed: 04/06/2020 Elsevier Patient Education  2022 [employer] Signs and Symptoms of Labor Labor is the body's natural process of moving the baby and the placenta  out of the uterus. The process of labor usually starts when the baby is full-term, between 40 and 41 weeks of pregnancy. Signs and symptoms that you are close to going into labor As your body prepares for labor and the birth of your baby, you may notice the following symptoms in the weeks and days before true labor starts: Passing a small amount of thick, bloody mucus from your vagina. This is called normal bloody show or losing your mucus plug. This may happen more than a week before labor begins, or right before labor begins, as the opening of the cervix starts to widen (dilate). For some women, the entire mucus plug passes at once. For others, pieces of the mucus plug may gradually pass over several days. Your baby moving (dropping) lower in your pelvis to get into position for birth (lightening). When this happens, you may feel more pressure on your bladder and pelvic bone and less pressure on your ribs. This may make it easier to breathe. It may also cause you to need to urinate more often and have problems with bowel movements. Having "practice contractions," also called Braxton Hicks contractions or false labor. These occur at irregular (unevenly spaced) intervals that are more than 10 minutes apart. False labor contractions are common after exercise or sexual activity. They will stop if you change position, rest,  or drink fluids. These contractions are usually mild and do not get stronger over time. They may feel like: A backache or back pain. Mild cramps, similar to menstrual cramps. Tightening or pressure in your abdomen. Other early symptoms include: Nausea or loss of appetite. Diarrhea. Having a sudden burst of energy, or feeling very tired. Mood changes. Having trouble sleeping. Signs and symptoms that labor has begun Signs that you are in labor may include: Having contractions that come at regular (evenly spaced) intervals and increase in intensity. This may feel like more intense tightening or pressure in your abdomen that moves to your back. Contractions may also feel like rhythmic pain in your upper thighs or back that comes and goes at regular intervals. If you are delivering for the first time, this change in intensity of contractions often occurs at a more gradual pace. If you have given birth before, you may notice a more rapid progression of contraction changes. Feeling pressure in the vaginal area. Your water breaking (rupture of membranes). This is when the sac of fluid that surrounds your baby breaks. Fluid leaking from your vagina may be clear or blood-tinged. Labor usually starts within 24 hours of your water breaking, but it may take longer to begin. Some people may feel a sudden gush of fluid; others may notice repeatedly damp underwear. Follow these instructions at home:  When labor starts, or if your water breaks, call your health care provider or nurse care line. Based on your situation, they will determine when you should go in for an exam. During early labor, you may be able to rest and manage symptoms at home. Some strategies to try at home include: Breathing and relaxation techniques. Taking a warm bath or shower. Listening to music. Using a heating pad on the lower back for pain. If directed, apply heat to the area as often as told by your health care provider. Use the  heat source that your health care provider recommends, such as a moist heat pack or a heating pad. Place a towel between your skin and the heat source. Leave the heat on for 20-30 minutes. Remove the  heat if your skin turns bright red. This is especially important if you are unable to feel pain, heat, or cold. You have a greater risk of getting burned. Contact a health care provider if: Your labor has started. Your water breaks. You have nausea, vomiting, or diarrhea. Get help right away if: You have painful, regular contractions that are 5 minutes apart or less. Labor starts before you are [redacted] weeks along in your pregnancy. You have a fever. You have bright red blood coming from your vagina. You do not feel your baby moving. You have a severe headache with or without vision problems. You have chest pain or shortness of breath. These symptoms may represent a serious problem that is an emergency. Do not wait to see if the symptoms will go away. Get medical help right away. Call your local emergency services (911 in the [place] Do not drive yourself to the hospital. Summary Labor is your body's natural process of moving your baby and the placenta out of your uterus. The process of labor usually starts when your baby is full-term, between 67 and 40 weeks of pregnancy. When labor starts, or if your water breaks, call your health care provider or nurse care line. Based on your situation, they will determine when you should go in for an exam. This information is not intended to replace advice given to you by your health care provider. Make sure you discuss any questions you have with your health care provider. Document Revised: 09/22/2020 Document Reviewed: 09/22/2020 Elsevier Patient Education  2022 [employer] Pain Relief During Labor and Delivery Many things can cause pain during labor and delivery, including: Pressure due to the baby moving through the pelvis. Stretching of tissues due to  the baby moving through the birth canal. Muscle tension due to anxiety or nervousness. The uterus tightening (contracting)and relaxing to help move the baby. How do I get pain relief during labor and delivery? Discuss your pain relief options with your health care provider during your prenatal visits. Explore the options offered by your hospital or birth center. There are many ways to deal with the pain of labor and delivery. You can try relaxation techniques or doing relaxing activities, taking a warm shower or bath (hydrotherapy), or other methods. There are also many medicines available to help control pain. Relaxation techniques and activities Practice relaxation techniques or do relaxing activities, such as: Focused breathing. Meditation. Visualization. Aroma therapy. Listening to your favorite music. Hypnosis. Hydrotherapy Take a warm shower or bath. This may: Provide comfort and relaxation. Lessen your feeling of pain. Reduce the amount of pain medicine needed. Shorten the length of labor. Other methods Try doing other things, such as: Getting a massage or having counterpressure on your back. Applying warm packs or ice packs. Changing positions often, moving around, or using a birthing ball. Medicines You may be given: Pain medicine through an IV or an injection into a muscle. Pain medicine inserted into your spinal column. Injections of sterile water just under the skin on your lower back. Nitrous oxide inhalation therapy, also called laughing gas. What kinds of medicine are available for pain relief? There are two kinds of medicines that can be used to relieve pain during labor and delivery: Analgesics. These medicines decrease pain without causing you to lose feeling or the ability to move your muscles. Anesthetics. These medicines block feeling in the body and can decrease your ability to move freely. Both kinds of medicine can cause minor side effects, such as  nausea,  trouble concentrating, and sleepiness. They can also affect the baby's heart rate before birth and his or her breathing after birth. For this reason, health care providers are careful about when and how much medicine is given. Which medicines are used to provide pain relief? Common medicines The most common medicines used to help manage pain during labor and delivery include: Opioids. Opioids are medicines that decrease how much pain you feel (perception of pain). These medicines can be given through an IV or may be used with anesthetics to block pain. Epidural analgesia. Epidural analgesia is given through a very thin tube that is inserted into the lower back. Medicine is delivered continuously to the area near your spinal column nerves (epidural space). After having this treatment, you may be able to move your legs, but you will not be able to walk. Depending on the amount and type of medicine given, you may lose all feeling in the lower half of your body, or you may have some sensation, including the urge to push. This treatment can be used to give pain relief for a vaginal birth. Sometimes, a numbing medicine is injected into the spinal fluid when an epidural catheter is placed. This provides for immediate relief but only lasts for 1-2 hours. Once it wears off, the epidural will provide pain relief. This is called a combined spinal-epidural (CSE) block. Intrathecal analgesia (spinal analgesia). Intrathecal analgesia is similar to epidural analgesia, but the medicine is injected into the spinal fluid instead of the epidural space. It is usually only given once. It starts to relieve pain quickly, but the pain relief lasts only 1-2 hours. Pudendal block. This block is done by injecting numbing medicine through the wall of the vagina and into a nerve in the pelvis. Other medicines Other medicines used to help manage pain during labor and delivery include: Local anesthetics. These are used to numb a small  area of the body. They may be used along with another kind of medicine or used to numb the nerves of the vagina, cervix, and perineum during the second stage of labor. Spinal block (spinal anesthesia). Spinal anesthesia is similar to spinal analgesia, but the medicine that is used contains longer-acting numbing medicines and pain medicines. This type of anesthesia can be used for a cesarean delivery and allows you to stay awake for the birth of your baby. General anesthetics cause you to lose consciousness so you do not feel pain. They are usually only used for an emergency cesarean delivery. These medicines are given through an IV or a mask or both. These medicines are used as part of a procedure or for an emergency delivery. Summary Women have many options to help them manage the pain associated with labor and delivery. You can try doing relaxing activities, taking a warm shower or bath, or other methods. There are also many medicines available to help control pain during labor and delivery. Talk with your health care provider about what options are available to you. This information is not intended to replace advice given to you by your health care provider. Make sure you discuss any questions you have with your health care provider. Document Revised: 03/27/2019 Document Reviewed: 03/27/2019 Elsevier Patient Education  2022 [employer].

## 2021-05-21 NOTE — Progress Notes (Signed)
Routine Prenatal Care Visit  Subjective  Kaitlyn Galloway is a 20 y.o. G1P0 at [redacted]w[redacted]d being seen today for ongoing prenatal care.  She is currently monitored for the following issues for this low-risk pregnancy and has Asthma, well controlled; Encounter for supervision of normal first pregnancy in first trimester; Nausea/vomiting in pregnancy; Anemia in pregnancy, third trimester; and Symptomatic anemia on their problem list.  ----------------------------------------------------------------------------------- Patient reports no complaints.   Contractions: Irregular. Vag. Bleeding: None.  Movement: Present. Leaking Fluid denies.  ----------------------------------------------------------------------------------- The following portions of the patient's history were reviewed and updated as appropriate: allergies, current medications, past family history, past medical history, past social history, past surgical history and problem list. Problem list updated.  Objective  Blood pressure 122/78, weight 194 lb (88 kg), last menstrual period 08/26/2020. Pregravid weight 150 lb (68 kg) Total Weight Gain 44 lb (20 kg) Urinalysis: Urine Protein    Urine Glucose    Fetal Status: Fetal Heart Rate (bpm): 127 Fundal Height: 38 cm Movement: Present  Presentation: Vertex  General:  Alert, oriented and cooperative. Patient is in no acute distress.  Skin: Skin is warm and dry. No rash noted.   Cardiovascular: Normal heart rate noted  Respiratory: Normal respiratory effort, no problems with respiration noted  Abdomen: Soft, gravid, appropriate for gestational age. Pain/Pressure: Absent     Pelvic:  cervical sweep Dilation: 2 Effacement (%): 60 Station: -2  Extremities: Normal range of motion.  Edema: None  Mental Status: Normal mood and affect. Normal behavior. Normal judgment and thought content.   Assessment   20 y.o. G1P0 at [redacted]w[redacted]d by  06/02/2021, by Last Menstrual Period presenting for routine prenatal  visit  Plan   Pregnancy #1 Problems (from 10/13/20 to present)    Problem Noted Resolved   Anemia in pregnancy, third trimester 03/17/2021 by [name], CNM No   Overview Signed 03/17/2021 10:14 PM by [name], CNM    H and H at 28 weeks : 8.5/26.8 Iron advised, and will plan to recheck her at [redacted] weeks gestation.      Encounter for supervision of normal first pregnancy in first trimester 10/13/2020 by Zipporah Plants, CNM No   Overview Addendum 05/18/2021  3:49 PM by [name], CNM     Nursing Staff Provider  Office Location  [facility] Dating   LMP=9week [place]  Language  English Anatomy US  Completed 8/29  Flu Vaccine   declined Genetic Screen  NIPS: normal XX  TDaP vaccine   declined Hgb A1C or  GTT Early : n/a Third trimester : 79  Rhogam   n/a   LAB RESULTS   Feeding Plan  breast Blood Type   A+  Contraception  Antibody   Negative  Circumcision  Rubella   Immune  Pediatrician   RPR   Non-reactive  Support Person  boyfriend HBsAg   Negative  Prenatal Classes  resources provided at [facility] HIV   Non-reactive    Varicella   Immune  BTL Consent  n/a GBS  (For PCN allergy, check sensitivities) negative       VBAC Consent  n/a Pap  collected 10/13/20    Hgb Electro   Normal hgb  Covid-19  Unvaccinated CF  considering     SMA  considering                  Term labor symptoms and general obstetric precautions including but not limited to vaginal bleeding, contractions, leaking of fluid and  fetal movement were reviewed in detail with the patient. Please refer to After Visit Summary for other counseling recommendations.   Return in about 1 week (around 05/28/2021).  Tresea Mall, CNM 05/21/2021 8:30 AM

## 2021-05-21 NOTE — Progress Notes (Signed)
ROB - requesting cervical exam. RM 4

## 2021-05-22 ENCOUNTER — Observation Stay
Admission: EM | Admit: 2021-05-22 | Discharge: 2021-05-22 | Disposition: A | Discharge status: Home / Self Care | Payer: BC Managed Care – PPO | Attending: Obstetrics and Gynecology | Admitting: Obstetrics and Gynecology

## 2021-05-22 ENCOUNTER — Other Ambulatory Visit: Payer: Self-pay

## 2021-05-22 DIAGNOSIS — O471 False labor at or after 37 completed weeks of gestation: Principal | ICD-10-CM | POA: Insufficient documentation

## 2021-05-22 DIAGNOSIS — Z3A38 38 weeks gestation of pregnancy: Secondary | ICD-10-CM | POA: Diagnosis not present

## 2021-05-22 NOTE — Discharge Summary (Signed)
Physician Final Progress Note  Patient ID: Kaitlyn Galloway MRN: [id number] DOB/AGE: 11-Sep-2000 20 y.o.  Admit date: 05/22/2021 Admitting provider: Vena Austria, MD Discharge date: 05/22/2021   Admission Diagnoses: Contractions  Discharge Diagnoses:  Principal Problem:   Labor and delivery indication for care or intervention  20 y.o. G1P0 at [redacted]w[redacted]d by Estimated Date of Delivery: 06/02/21 second presentation in 24-hrs for contractions.  Cervix unchanged from prior check on arrival and remains unchanged after 1-hr recheck.  +FM, no LOF, no VB.  Blood pressure 107/62, pulse 80, temperature 98.9 F (37.2 C), temperature source Oral, resp. rate 16, last menstrual period 08/26/2020.  Pregnancy #1 Problems (from 10/13/20 to present)     Problem Noted Resolved   Anemia in pregnancy, third trimester 03/17/2021 by [name], CNM No   Overview Signed 03/17/2021 10:14 PM by [name], CNM    H and H at 28 weeks : 8.5/26.8 Iron advised, and will plan to recheck her at [redacted] weeks gestation.      Encounter for supervision of normal first pregnancy in first trimester 10/13/2020 by Zipporah Plants, CNM No   Overview Addendum 05/18/2021  3:49 PM by [name], CNM     Nursing Staff Provider  Office Location  [facility] Dating   LMP=9week [place]  Language  English Anatomy US  Completed 8/29  Flu Vaccine   declined Genetic Screen  NIPS: normal XX  TDaP vaccine   declined Hgb A1C or  GTT Early : n/a Third trimester : 79  Rhogam   n/a   LAB RESULTS   Feeding Plan  breast Blood Type   A+  Contraception  Antibody   Negative  Circumcision  Rubella   Immune  Pediatrician   RPR   Non-reactive  Support Person  boyfriend HBsAg   Negative  Prenatal Classes  resources provided at [facility] HIV   Non-reactive    Varicella   Immune  BTL Consent  n/a GBS  (For PCN allergy, check sensitivities) negative       VBAC Consent  n/a Pap  collected 10/13/20    Hgb Electro   Normal hgb  Covid-19   Unvaccinated CF  considering     SMA  considering                  Consults: None  Significant Findings/ Diagnostic Studies: none  Procedures:  Baseline: 135 Variability: moderate Accelerations: present Decelerations: absent Tocometry: irregular, every 2-7 minutes The patient was monitored for 30 minutes, fetal heart rate tracing was deemed reactive, category I tracing,  CPT [id number]   Discharge Condition: good  Disposition: Discharge disposition: 01-Home or Self Care       Diet: Regular diet  Discharge Activity: Activity as tolerated  Discharge Instructions     Discharge activity:  No Restrictions   Complete by: As directed    Discharge diet:  No restrictions   Complete by: As directed    Fetal Kick Count:  Lie on our left side for one hour after a meal, and count the number of times your baby kicks.  If it is less than 5 times, get up, move around and drink some juice.  Repeat the test 30 minutes later.  If it is still less than 5 kicks in an hour, notify your doctor.   Complete by: As directed    LABOR:  When conractions begin, you should start to time them from the beginning of one contraction to the beginning  of  the next.  When contractions are 5 - 10 minutes apart or less and have been regular for at least an hour, you should call your health care provider.   Complete by: As directed    No sexual activity restrictions   Complete by: As directed    Notify physician for bleeding from the vagina   Complete by: As directed    Notify physician for blurring of vision or spots before the eyes   Complete by: As directed    Notify physician for chills or fever   Complete by: As directed    Notify physician for fainting spells, "black outs" or loss of consciousness   Complete by: As directed    Notify physician for increase in vaginal discharge   Complete by: As directed    Notify physician for leaking of fluid   Complete by: As directed    Notify physician  for pain or burning when urinating   Complete by: As directed    Notify physician for pelvic pressure (sudden increase)   Complete by: As directed    Notify physician for severe or continued nausea or vomiting   Complete by: As directed    Notify physician for sudden gushing of fluid from the vagina (with or without continued leaking)   Complete by: As directed    Notify physician for sudden, constant, or occasional abdominal pain   Complete by: As directed    Notify physician if baby moving less than usual   Complete by: As directed       Allergies as of 05/22/2021   No Known Allergies      Medication List     TAKE these medications    albuterol 108 (90 Base) MCG/ACT inhaler Commonly known as: VENTOLIN HFA Inhale into the lungs every 6 (six) hours as needed for wheezing or shortness of breath.   Prenatabs Rx 29-1 MG Tabs Take 1 tablet by mouth daily.         Total time spent taking care of this patient: triaged remotely  Signed: Vena Austria 05/22/2021, 9:59 PM

## 2021-05-22 NOTE — OB Triage Note (Incomplete)
Pt is a G1P0 with UCs on and off x 24 hrs. Increasing around 1400 to painful 5/10. Denies medical or ob problems. +FM no leaking of fluid or bleeding

## 2021-05-23 NOTE — L&D Delivery Note (Signed)
Obstetrical Delivery Note   Date of Delivery:   05/29/2021 Primary OB:   [facility] Gestational Age/EDD: [redacted]w[redacted]d (Dated by L and 9wk [place]) Reason for Admission: Spontaneous Labor  Antepartum complications: anemia  Delivered By:   Siri Cole, CNM   Delivery Type:   SVB   Delivery Details:   Labor started at 1000, on admission VE 4.5/90/0.  Nitrous started. Progressed quickly to 9 cm, AROM offered and accepted. AROM at 1916 for clear fluid. She began pushing soon after. Pushed effectively in reclined then to squatting and back to reclined. Head birthed at 1949 OA, no restitution of head noted, nuchal x1 reduced, then head restituted to LOA, no movement of shoulders, attempted to sweep posterior arm with no success, then rotated shoulders internally a little followed by SP pressure by the RN, followed by Body at 1950.  Infant to mom's chest, cord immediately clamped and cut, infant taken to warmer for resuscitation. See notes.  Placenta delivered with out difficulty.  Perineum intact but one bleeding site on left labia 1 stitch placed with 3-0 vicryl, hemostatic. Once mom cleaned up,   infant then placed skin to skin on mom, both stable.   Anesthesia:    Nitrous oxide  Intrapartum complications: Shoulder Dystocia GBS:    Negative Laceration:    None, 1 stitch placed in left labia to stop bleeding.  Episiotomy:    none Rectal exam:   Deferred  Placenta:    Delivered and expressed via active management. Intact: yes. To pathology: no.  Delayed Cord Clamping: no Estimated Blood Loss:   Baby:    Liveborn female, APGARs 7/9, weight pending gm

## 2021-05-28 ENCOUNTER — Ambulatory Visit (INDEPENDENT_AMBULATORY_CARE_PROVIDER_SITE_OTHER): Payer: BC Managed Care – PPO | Admitting: Obstetrics

## 2021-05-28 ENCOUNTER — Other Ambulatory Visit: Payer: Self-pay

## 2021-05-28 VITALS — BP 124/78 | Wt 195.0 lb

## 2021-05-28 DIAGNOSIS — Z3400 Encounter for supervision of normal first pregnancy, unspecified trimester: Secondary | ICD-10-CM

## 2021-05-28 DIAGNOSIS — Z3A39 39 weeks gestation of pregnancy: Secondary | ICD-10-CM

## 2021-05-28 NOTE — Progress Notes (Signed)
Cervical check today. No vb. No lof  

## 2021-05-28 NOTE — Progress Notes (Signed)
Routine Prenatal Care Visit  Subjective  Kaitlyn Galloway is a 21 y.o. G1P0 at [redacted]w[redacted]d being seen today for ongoing prenatal care.  She is currently monitored for the following issues for this low-risk pregnancy and has Asthma, well controlled; Encounter for supervision of normal first pregnancy in first trimester; Nausea/vomiting in pregnancy; Anemia in pregnancy, third trimester; Symptomatic anemia; and Labor and delivery indication for care or intervention on their problem list.  ----------------------------------------------------------------------------------- Patient reports no complaints.  She is eager for labor.Was seen on the unit for a labor check and found to be 2 cms dilated. Contractions: Irregular. Vag. Bleeding: None.  Movement: Present. Leaking Fluid denies.  ----------------------------------------------------------------------------------- The following portions of the patient's history were reviewed and updated as appropriate: allergies, current medications, past family history, past medical history, past social history, past surgical history and problem list. Problem list updated.  Objective  Blood pressure 124/78, weight 195 lb (88.5 kg), last menstrual period 08/26/2020. Pregravid weight 150 lb (68 kg) Total Weight Gain 45 lb (20.4 kg) Urinalysis: Urine Protein    Urine Glucose    Fetal Status:     Movement: Present     General:  Alert, oriented and cooperative. Patient is in no acute distress.  Skin: Skin is warm and dry. No rash noted.   Cardiovascular: Normal heart rate noted  Respiratory: Normal respiratory effort, no problems with respiration noted  Abdomen: Soft, gravid, appropriate for gestational age. Pain/Pressure: Absent     Pelvic:  Cervical exam performed      2-2.5 with sweep  Extremities: Normal range of motion.     Mental Status: Normal mood and affect. Normal behavior. Normal judgment and thought content.   Assessment   21 y.o. G1P0 at [redacted]w[redacted]d by   06/02/2021, by Last Menstrual Period presenting for routine prenatal visit  Plan   Pregnancy #1 Problems (from 10/13/20 to present)    Problem Noted Resolved   Anemia in pregnancy, third trimester 03/17/2021 by [name], CNM No   Overview Signed 03/17/2021 10:14 PM by [name], CNM    H and H at 28 weeks : 8.5/26.8 Iron advised, and will plan to recheck her at [redacted] weeks gestation.      Encounter for supervision of normal first pregnancy in first trimester 10/13/2020 by Zipporah Plants, CNM No   Overview Addendum 05/18/2021  3:49 PM by [name], CNM     Nursing Staff Provider  Office Location  [facility] Dating   LMP=9week [place]  Language  English Anatomy US  Completed 8/29  Flu Vaccine   declined Genetic Screen  NIPS: normal XX  TDaP vaccine   declined Hgb A1C or  GTT Early : n/a Third trimester : 79  Rhogam   n/a   LAB RESULTS   Feeding Plan  breast Blood Type   A+  Contraception  Antibody   Negative  Circumcision  Rubella   Immune  Pediatrician   RPR   Non-reactive  Support Person  boyfriend HBsAg   Negative  Prenatal Classes  resources provided at [facility] HIV   Non-reactive    Varicella   Immune  BTL Consent  n/a GBS  (For PCN allergy, check sensitivities) negative       VBAC Consent  n/a Pap  collected 10/13/20    Hgb Electro   Normal hgb  Covid-19  Unvaccinated CF  considering     SMA  considering  Term labor symptoms and general obstetric precautions including but not limited to vaginal bleeding, contractions, leaking of fluid and fetal movement were reviewed in detail with the patient. Please refer to After Visit Summary for other counseling recommendations.  With her consent, had cervical sweep today. Counseld about letting her contractions build and become very regular before reporting to the hospital.  Return in about 1 week (around 06/04/2021) for return OB, address IOL at 41 wks.  [name], CNM  05/28/2021 8:13 AM

## 2021-05-29 ENCOUNTER — Encounter: Payer: Self-pay | Admitting: Obstetrics & Gynecology

## 2021-05-29 ENCOUNTER — Encounter: Payer: Self-pay | Admitting: Internal Medicine

## 2021-05-29 ENCOUNTER — Other Ambulatory Visit: Payer: Self-pay

## 2021-05-29 ENCOUNTER — Inpatient Hospital Stay
Admission: EM | Admit: 2021-05-29 | Discharge: 2021-05-31 | DRG: 807 | Disposition: A | Discharge status: Home / Self Care | Payer: BC Managed Care – PPO | Attending: Licensed Practical Nurse | Admitting: Licensed Practical Nurse

## 2021-05-29 DIAGNOSIS — Z20822 Contact with and (suspected) exposure to covid-19: Secondary | ICD-10-CM | POA: Diagnosis present

## 2021-05-29 DIAGNOSIS — Z3A39 39 weeks gestation of pregnancy: Secondary | ICD-10-CM

## 2021-05-29 DIAGNOSIS — Z3401 Encounter for supervision of normal first pregnancy, first trimester: Secondary | ICD-10-CM

## 2021-05-29 DIAGNOSIS — O9902 Anemia complicating childbirth: Secondary | ICD-10-CM | POA: Diagnosis not present

## 2021-05-29 DIAGNOSIS — Z87891 Personal history of nicotine dependence: Secondary | ICD-10-CM | POA: Diagnosis not present

## 2021-05-29 DIAGNOSIS — O26893 Other specified pregnancy related conditions, third trimester: Secondary | ICD-10-CM | POA: Diagnosis not present

## 2021-05-29 DIAGNOSIS — O99013 Anemia complicating pregnancy, third trimester: Secondary | ICD-10-CM

## 2021-05-29 DIAGNOSIS — D509 Iron deficiency anemia, unspecified: Secondary | ICD-10-CM | POA: Diagnosis present

## 2021-05-29 LAB — URINE DRUG SCREEN, QUALITATIVE (ARMC ONLY)
Amphetamines, Ur Screen: NOT DETECTED
Barbiturates, Ur Screen: NOT DETECTED
Benzodiazepine, Ur Scrn: NOT DETECTED
Cannabinoid 50 Ng, Ur ~~LOC~~: NOT DETECTED
Cocaine Metabolite,Ur ~~LOC~~: NOT DETECTED
MDMA (Ecstasy)Ur Screen: NOT DETECTED
Methadone Scn, Ur: NOT DETECTED
Opiate, Ur Screen: NOT DETECTED
Phencyclidine (PCP) Ur S: NOT DETECTED
Tricyclic, Ur Screen: NOT DETECTED

## 2021-05-29 LAB — CBC
HCT: 38.4 % (ref 36.0–46.0)
Hemoglobin: 12.4 g/dL (ref 12.0–15.0)
MCH: 26.8 pg (ref 26.0–34.0)
MCHC: 32.3 g/dL (ref 30.0–36.0)
MCV: 82.9 fL (ref 80.0–100.0)
Platelets: 217 10*3/uL (ref 150–400)
RBC: 4.63 MIL/uL (ref 3.87–5.11)
RDW: 21 % — ABNORMAL HIGH (ref 11.5–15.5)
WBC: 7.3 10*3/uL (ref 4.0–10.5)
nRBC: 0 % (ref 0.0–0.2)

## 2021-05-29 LAB — TYPE AND SCREEN
ABO/RH(D): A POS
Antibody Screen: NEGATIVE

## 2021-05-29 LAB — CHLAMYDIA/NGC RT PCR (ARMC ONLY)
Chlamydia Tr: NOT DETECTED
N gonorrhoeae: NOT DETECTED

## 2021-05-29 LAB — RESP PANEL BY RT-PCR (FLU A&B, COVID) ARPGX2
Influenza A by PCR: NEGATIVE
Influenza B by PCR: NEGATIVE
SARS Coronavirus 2 by RT PCR: NEGATIVE

## 2021-05-29 LAB — ABO/RH: ABO/RH(D): A POS

## 2021-05-29 MED ORDER — LIDOCAINE HCL (PF) 1 % IJ SOLN
INTRAMUSCULAR | Status: AC
  Administered 2021-05-29: 30 mL via SUBCUTANEOUS
  Filled 2021-05-29: qty 30

## 2021-05-29 MED ORDER — ZOLPIDEM TARTRATE 5 MG PO TABS: 5.0000 mg | ORAL_TABLET | Freq: Every evening | ORAL | Status: DC | PRN

## 2021-05-29 MED ORDER — OXYTOCIN 10 UNIT/ML IJ SOLN
INTRAMUSCULAR | Status: AC
  Filled 2021-05-29: qty 2

## 2021-05-29 MED ORDER — LACTATED RINGERS IV SOLN: INTRAVENOUS | Status: DC

## 2021-05-29 MED ORDER — PRENATAL MULTIVITAMIN CH
1.0000 | ORAL_TABLET | Freq: Every day | ORAL | Status: DC
  Administered 2021-05-30: 1 via ORAL
  Filled 2021-05-29: qty 1

## 2021-05-29 MED ORDER — WITCH HAZEL-GLYCERIN EX PADS: 1.0000 "application " | MEDICATED_PAD | CUTANEOUS | Status: DC | PRN

## 2021-05-29 MED ORDER — AMMONIA AROMATIC IN INHA
RESPIRATORY_TRACT | Status: AC
  Filled 2021-05-29: qty 10

## 2021-05-29 MED ORDER — ACETAMINOPHEN 500 MG PO TABS
1000.0000 mg | ORAL_TABLET | Freq: Four times a day (QID) | ORAL | Status: DC
  Administered 2021-05-30 – 2021-05-31 (×2): 1000 mg via ORAL
  Filled 2021-05-29 (×3): qty 2

## 2021-05-29 MED ORDER — BUTORPHANOL TARTRATE 1 MG/ML IJ SOLN: 1.0000 mg | INTRAMUSCULAR | Status: DC | PRN

## 2021-05-29 MED ORDER — OXYTOCIN-SODIUM CHLORIDE 30-0.9 UT/500ML-% IV SOLN
INTRAVENOUS | Status: AC
  Administered 2021-05-29: 333 mL via INTRAVENOUS
  Filled 2021-05-29: qty 500

## 2021-05-29 MED ORDER — ONDANSETRON HCL 4 MG/2ML IJ SOLN: 4.0000 mg | INTRAMUSCULAR | Status: DC | PRN

## 2021-05-29 MED ORDER — LACTATED RINGERS IV SOLN
500.0000 mL | INTRAVENOUS | Status: DC | PRN
  Administered 2021-05-29: 1000 mL via INTRAVENOUS

## 2021-05-29 MED ORDER — COCONUT OIL OIL
1.0000 "application " | TOPICAL_OIL | Status: DC | PRN
  Filled 2021-05-29: qty 120

## 2021-05-29 MED ORDER — ONDANSETRON HCL 4 MG/2ML IJ SOLN
4.0000 mg | Freq: Four times a day (QID) | INTRAMUSCULAR | Status: DC | PRN
  Administered 2021-05-29: 4 mg via INTRAVENOUS
  Filled 2021-05-29: qty 2

## 2021-05-29 MED ORDER — DIPHENHYDRAMINE HCL 25 MG PO CAPS: 25.0000 mg | ORAL_CAPSULE | Freq: Four times a day (QID) | ORAL | Status: DC | PRN

## 2021-05-29 MED ORDER — DIBUCAINE (PERIANAL) 1 % EX OINT: 1.0000 "application " | TOPICAL_OINTMENT | CUTANEOUS | Status: DC | PRN

## 2021-05-29 MED ORDER — SOD CITRATE-CITRIC ACID 500-334 MG/5ML PO SOLN: 30.0000 mL | ORAL | Status: DC | PRN

## 2021-05-29 MED ORDER — OXYTOCIN-SODIUM CHLORIDE 30-0.9 UT/500ML-% IV SOLN: 2.5000 [IU]/h | INTRAVENOUS | Status: DC

## 2021-05-29 MED ORDER — ONDANSETRON HCL 4 MG PO TABS: 4.0000 mg | ORAL_TABLET | ORAL | Status: DC | PRN

## 2021-05-29 MED ORDER — MISOPROSTOL 200 MCG PO TABS
ORAL_TABLET | ORAL | Status: AC
  Filled 2021-05-29: qty 4

## 2021-05-29 MED ORDER — IBUPROFEN 600 MG PO TABS
600.0000 mg | ORAL_TABLET | Freq: Four times a day (QID) | ORAL | Status: DC
  Administered 2021-05-30 – 2021-05-31 (×2): 600 mg via ORAL
  Filled 2021-05-29 (×3): qty 1

## 2021-05-29 MED ORDER — BENZOCAINE-MENTHOL 20-0.5 % EX AERO
1.0000 "application " | INHALATION_SPRAY | CUTANEOUS | Status: DC | PRN
  Filled 2021-05-29: qty 56

## 2021-05-29 MED ORDER — DOCUSATE SODIUM 100 MG PO CAPS
100.0000 mg | ORAL_CAPSULE | Freq: Two times a day (BID) | ORAL | Status: DC
  Administered 2021-05-29 – 2021-05-30 (×3): 100 mg via ORAL
  Filled 2021-05-29 (×3): qty 1

## 2021-05-29 MED ORDER — OXYTOCIN BOLUS FROM INFUSION: 333.0000 mL | Freq: Once | INTRAVENOUS | Status: AC

## 2021-05-29 MED ORDER — SIMETHICONE 80 MG PO CHEW: 80.0000 mg | CHEWABLE_TABLET | ORAL | Status: DC | PRN

## 2021-05-29 MED ORDER — LIDOCAINE HCL (PF) 1 % IJ SOLN: 30.0000 mL | INTRAMUSCULAR | Status: AC | PRN

## 2021-05-29 NOTE — H&P (Signed)
OB History & Physical   History of Present Illness:  Chief Complaint:   HPI:  Kaitlyn Galloway is a 21 y.o. G1P0 female at [redacted]w[redacted]d dated by LMP and 9wkUS.  She presents to L&D for contractions. Contractions started around 1000, they are now every 3 minutes.  Endorse+FM, denies LOF/VB, but has seen some bloody discharge with wiping. Had early and regular prenatal care. Her pregnancy has been complicated by  BMI >30, iron deficiency anemia and hx of asthma-has not used inhaler in a longtime.  +FM, no CTX, no LOF, no VB  Pregnancy Issues: 1. Iron deficiency anemia  2. BMI >30   Maternal Medical History:   Past Medical History:  Diagnosis Date   Asthma    DR. PRINGLE   Heart murmur    INNOCENT   Leakage of amniotic fluid 05/04/2021    Past Surgical History:  Procedure Laterality Date   NO PAST SURGERIES      No Known Allergies  Prior to Admission medications   Medication Sig Start Date End Date Taking? Authorizing Provider  albuterol (PROVENTIL HFA;VENTOLIN HFA) 108 (90 Base) MCG/ACT inhaler Inhale into the lungs every 6 (six) hours as needed for wheezing or shortness of breath.   Yes [provider]  Prenatal Vit-Iron Carbonyl-FA (PRENATABS RX) 29-1 MG TABS Take 1 tablet by mouth daily. 10/13/20  Yes Zipporah Plants, CNM     Prenatal care site: [facility]  Social History: She  reports that she has quit smoking. Her smoking use included e-cigarettes. She has never used smokeless tobacco. She reports that she does not currently use alcohol. She reports that she does not currently use drugs after having used the following drugs: Marijuana.  Family History: family history includes Breast cancer (age of onset: 18) in an other family member; Cancer (age of onset: 58) in her maternal uncle; Cancer (age of onset: 46) in her maternal grandmother; Cancer (age of onset: 9) in an other family member; Hypertension in her maternal aunt and maternal grandmother.   Review of Systems:  A full review of systems was performed and negative except as noted in the HPI.     Physical Exam:  Vital Signs: BP 129/88 (BP Location: Left Arm)    Pulse 87    Temp 98.1 F (36.7 C) (Oral)    Resp 18    Ht 5\' 3"  (1.6 m)    Wt 88.5 kg    LMP 08/26/2020 (Approximate)    BMI 34.54 kg/m  General: no acute distress.  HEENT: normocephalic, atraumatic Heart: regular rate & rhythm.  No murmurs/rubs/gallops Lungs: clear to auscultation bilaterally, normal respiratory effort Abdomen: soft, gravid, non-tender;  EFW: 8lbs Pelvic:   External: Normal external female genitalia  Cervix: Dilation: 4.5 / Effacement (%): 90 / Station: 0    Extremities: non-tender, symmetric, no edema bilaterally.   Neurologic: Alert & oriented x 3.    No results found for this or any previous visit (from the past 24 hour(s)).  Pertinent Results:  Prenatal Labs: Blood type/Rh A+  Antibody screen neg  Rubella Immune  Varicella Immune  RPR NR  HBsAg Neg  HIV NR  GC neg  Chlamydia neg  Genetic screening negative  1 hour GTT 76  3 hour GTT NA  GBS neg   FHT: baseline 120, moderate variability, pos accel, neg decel  TOCO: q1.5-3, moderate, soft resting tone  SVE:  Dilation: 4.5 / Effacement (%): 90 / Station: 0    Cephalic by leopolds, EFW  8 lbs   No results found.  Assessment:  Kaitlyn Galloway is a 21 y.o. G1P0 female at [redacted]w[redacted]d admitted in labor.   Plan:  Admit to Labor & Delivery CBC, T&S, heplock IV GBS  negative  Consents obtained. intermittent monitoring,  Pain management: aware of all options will ask if desired   ----- Carie Caddy, CNM  [facility]

## 2021-05-29 NOTE — Discharge Summary (Signed)
Obstetrical Discharge Summary  Date of Admission: 05/29/2021 Date of Discharge: 05/31/2021  Primary OB: [facility]  Gestational Age at Delivery: [redacted]w[redacted]d   Antepartum complications: anemia, iron deficiency  Reason for Admission: spontaneous labor  Date of Delivery: 05/29/2021  Delivered By: Siri Cole, CNM  Delivery Type: spontaneous vaginal delivery Intrapartum complications/course: Shoulder Dystocia Anesthesia:  nitrous, local  Placenta: Delivered and expressed via active management. Intact: yes. To pathology: no.  Laceration: none 1 stitch placed in left labia for bleeding  Episiotomy: none EBL: Baby: Liveborn female, APGARs 7/9, weight 3190 g.    Discharge Diagnosis: Delivered. Full term  Postpartum course: Patient is tolerating regular diet. Her pain is controlled with PO medication. She is ambulating and voiding without difficulty. She reports breastfeeding is going well.  Discharge Vital Signs:  Current Vital Signs 24h Vital Sign Ranges  T 98.3 F (36.8 C) Temp  Avg: 98.8 F (37.1 C)  Min: 98.3 F (36.8 C)  Max: 99.3 F (37.4 C)  BP 120/77 BP  Min: 119/66  Max: 126/66  HR 60 Pulse  Avg: 69  Min: 59  Max: 85  RR 18 Resp  Avg: 18.4  Min: 18  Max: 20  SaO2 100 % Room Air SpO2  Avg: 99.3 %  Min: 98 %  Max: 100 %       24 Hour I/O Current Shift I/O  Time Ins Outs No intake/output data recorded. No intake/output data recorded.     Patient Vitals for the past 6 hrs:  BP Temp Temp src Pulse Resp SpO2  05/31/21 0811 120/77 98.3 F (36.8 C) Oral 60 18 100 %    Discharge Exam:  NAD Perineum: intact Abdomen: firm fundus below the umbilicus, NTTP, non distended, +bowel sounds.  Incision: NA RRR no MRGs CTAB Ext: no c/c/e  Recent Labs  Lab 05/29/21 1551 05/30/21 0647  WBC 7.3 8.5  HGB 12.4 10.2*  HCT 38.4 31.8*  PLT 217 182    Disposition: Home  Rh Immune globulin given: no Rubella vaccine given: no Tdap vaccine given in AP or PP setting: declined  Flu  vaccine given in AP or PP setting: declined   Contraception:  Nexplanon  Prenatal/Postnatal Panel: A POS Performed at [facility], [address] [place], [place] [address] Ishmael Holter Immune//Varicella Immune//RPR negative//HIV negative/HepB Surface Ag negative//pap no abnormalities (date: 10/13/2020)//plans to breastfeed  Plan:  [place] was discharged to home in good condition. Follow-up appointment with LMD in 2 weeks for a PP fu  visit  Future Appointments  Date Time Provider Department Center  06/04/2021  8:15 AM Vena Austria, MD [facility] None  06/11/2021  8:15 AM Jerene Pitch, Jaquelyn Bitter, MD [facility] None  06/18/2021  1:30 PM [facility] None  06/18/2021  2:00 PM Alinda Dooms, NP [facility] None  06/18/2021  2:30 PM [facility] INFUSION CHAIR 2 [facility] None    Discharge Medications: Allergies as of 05/31/2021   No Known Allergies      Medication List     TAKE these medications    albuterol 108 (90 Base) MCG/ACT inhaler Commonly known as: VENTOLIN HFA Inhale into the lungs every 6 (six) hours as needed for wheezing or shortness of breath.   Prenatabs Rx 29-1 MG Tabs Take 1 tablet by mouth daily.        Parke Poisson, CNM [facility] 05/31/2021, 10:27 AM

## 2021-05-29 NOTE — Progress Notes (Signed)
S: Resting now more uncomfortable desires Nitrous oxide.  Family at her side  O: Vitals:   05/29/21 1551  BP: 129/88  Pulse: 87  Resp: 18  Temp: 98.1 F (36.7 C)  TempSrc: Oral  Weight: 88.5 kg  Height: 5\' 3"  (1.6 m)     Gen: NAD, AAOx3      Abd: FNTTP      Ext: Non-tender, Nonedmeatous    FHT: 120 mod var + accelerations 2 isolated lates, some early decels.  TOCO: Q1.5-3 min SVE: 7/100/0   A/P:  21 y.o. yo G1P0 at [redacted]w[redacted]d for admitted in labor, no win active labor.  Rn to set up Nitrous oxide  FWB: Reassuring Cat 2 tracing. Overall reassuring  GBS: neg    [redacted]w[redacted]d [facility] 6:19 PM

## 2021-05-30 ENCOUNTER — Encounter: Payer: Self-pay | Admitting: Internal Medicine

## 2021-05-30 LAB — CBC
HCT: 31.8 % — ABNORMAL LOW (ref 36.0–46.0)
Hemoglobin: 10.2 g/dL — ABNORMAL LOW (ref 12.0–15.0)
MCH: 26.6 pg (ref 26.0–34.0)
MCHC: 32.1 g/dL (ref 30.0–36.0)
MCV: 83 fL (ref 80.0–100.0)
Platelets: 182 10*3/uL (ref 150–400)
RBC: 3.83 MIL/uL — ABNORMAL LOW (ref 3.87–5.11)
RDW: 21.1 % — ABNORMAL HIGH (ref 11.5–15.5)
WBC: 8.5 10*3/uL (ref 4.0–10.5)
nRBC: 0 % (ref 0.0–0.2)

## 2021-05-30 LAB — RPR: RPR Ser Ql: NONREACTIVE

## 2021-05-30 NOTE — Progress Notes (Signed)
Subjective:  Doing well.  Has rested some. Ambulating and urinating without difficulty. Has minimal pain.  Breastfeeding is going ok, infant sleepy but has latched.   Objective:  Vital signs in last 24 hours: Temp:  [98.1 F (36.7 C)-98.7 F (37.1 C)] 98.7 F (37.1 C) (01/08 0724) Pulse Rate:  [58-127] 87 (01/08 0724) Resp:  [17-18] 18 (01/08 0724) BP: (108-140)/(41-114) 113/76 (01/08 0724) SpO2:  [98 %-100 %] 98 % (01/08 0724) Weight:  [88.5 kg] 88.5 kg (01/07 1551)    General: NAD Breast: soft, nipples intact bilaterally  Pulmonary: no increased work of breathing Abdomen: non-distended, non-tender, fundus firm at level of umbilicus Perineum: slightly edematous  Lochia: min rubra Extremities: no edema, no erythema, no tenderness  Results for orders placed or performed during the hospital encounter of 05/29/21 (from the past 72 hour(s))  CBC     Status: Abnormal   Collection Time: 05/29/21  3:51 PM  Result Value Ref Range   WBC 7.3 4.0 - 10.5 K/uL   RBC 4.63 3.87 - 5.11 MIL/uL   Hemoglobin 12.4 12.0 - 15.0 g/dL   HCT 16.6 06.3 - 01.6 %   MCV 82.9 80.0 - 100.0 fL   MCH 26.8 26.0 - 34.0 pg   MCHC 32.3 30.0 - 36.0 g/dL   RDW 01.0 (H) 93.2 - 35.5 %   Platelets 217 150 - 400 K/uL   nRBC 0.0 0.0 - 0.2 %    Comment: Performed at [facility], [address] [place], [place] [address]  Type and screen [facility]     Status: None   Collection Time: 05/29/21  3:51 PM  Result Value Ref Range   ABO/RH(D) A POS    Antibody Screen NEG    Sample Expiration      06/01/2021,2359 Performed at [facility], [address] [place], [place] [address]   Chlamydia/NGC rt PCR [facility] only)     Status: None   Collection Time: 05/29/21  3:54 PM  Result Value Ref Range   Specimen source GC/Chlam URINE, RANDOM    Chlamydia Tr NOT DETECTED NOT DETECTED   N gonorrhoeae NOT DETECTED NOT DETECTED    Comment: (NOTE) This CT/NG assay has not been  evaluated in patients with a history of  hysterectomy. Performed at [facility], [address] [place], [place] [address]   Resp Panel by RT-PCR (Flu A&B, Covid) Nasopharyngeal Swab     Status: None   Collection Time: 05/29/21  3:57 PM   Specimen: Nasopharyngeal Swab; Nasopharyngeal(NP) swabs in vial transport medium  Result Value Ref Range   SARS Coronavirus 2 by RT PCR NEGATIVE NEGATIVE    Comment: (NOTE) SARS-CoV-2 target nucleic acids are NOT DETECTED.  The SARS-CoV-2 RNA is generally detectable in upper respiratory specimens during the acute phase of infection. The lowest concentration of SARS-CoV-2 viral copies this assay can detect is 138 copies/mL. A negative result does not preclude SARS-Cov-2 infection and should not be used as the sole basis for treatment or other patient management decisions. A negative result may occur with  improper specimen collection/handling, submission of specimen other than nasopharyngeal swab, presence of viral mutation(s) within the areas targeted by this assay, and inadequate number of viral copies(<138 copies/mL). A negative result must be combined with clinical observations, patient history, and epidemiological information. The expected result is Negative.  Fact Sheet for Patients:  BloggerCourse.com  Fact Sheet for Healthcare Providers:  SeriousBroker.it  This test is no t  yet approved or cleared by the [place] FDA and  has been authorized for detection and/or diagnosis of SARS-CoV-2 by FDA under an Emergency Use Authorization (EUA). This EUA will remain  in effect (meaning this test can be used) for the duration of the COVID-19 declaration under Section 564(b)(1) of the Act, 21 U.S.C.section 360bbb-3(b)(1), unless the authorization is terminated  or revoked sooner.       Influenza A by PCR NEGATIVE NEGATIVE   Influenza B by PCR NEGATIVE NEGATIVE    Comment:  (NOTE) The Xpert Xpress SARS-CoV-2/FLU/RSV plus assay is intended as an aid in the diagnosis of influenza from Nasopharyngeal swab specimens and should not be used as a sole basis for treatment. Nasal washings and aspirates are unacceptable for Xpert Xpress SARS-CoV-2/FLU/RSV testing.  Fact Sheet for Patients: BloggerCourse.comhttps://www.fda.gov/media/152166/download  Fact Sheet for Healthcare Providers: SeriousBroker.ithttps://www.fda.gov/media/152162/download  This test is not yet approved or cleared by the [place] FDA and has been authorized for detection and/or diagnosis of SARS-CoV-2 by FDA under an Emergency Use Authorization (EUA). This EUA will remain in effect (meaning this test can be used) for the duration of the COVID-19 declaration under Section 564(b)(1) of the Act, 21 U.S.C. section 360bbb-3(b)(1), unless the authorization is terminated or revoked.  Performed at [facility], [address] [place], [place] [address]   Urine Drug Screen, Qualitative [facility] only)     Status: None   Collection Time: 05/29/21  5:07 PM  Result Value Ref Range   Tricyclic, Ur Screen NONE DETECTED NONE DETECTED   Amphetamines, Ur Screen NONE DETECTED NONE DETECTED   MDMA (Ecstasy)Ur Screen NONE DETECTED NONE DETECTED   Cocaine Metabolite,Ur [place] NONE DETECTED NONE DETECTED   Opiate, Ur Screen NONE DETECTED NONE DETECTED   Phencyclidine (PCP) Ur S NONE DETECTED NONE DETECTED   Cannabinoid 50 Ng, Ur [place] NONE DETECTED NONE DETECTED   Barbiturates, Ur Screen NONE DETECTED NONE DETECTED   Benzodiazepine, Ur Scrn NONE DETECTED NONE DETECTED   Methadone Scn, Ur NONE DETECTED NONE DETECTED    Comment: (NOTE) Tricyclics + metabolites, urine    Cutoff 1000 ng/mL Amphetamines + metabolites, urine  Cutoff 1000 ng/mL MDMA (Ecstasy), urine              Cutoff 500 ng/mL Cocaine Metabolite, urine          Cutoff 300 ng/mL Opiate + metabolites, urine        Cutoff 300 ng/mL Phencyclidine (PCP), urine         Cutoff  25 ng/mL Cannabinoid, urine                 Cutoff 50 ng/mL Barbiturates + metabolites, urine  Cutoff 200 ng/mL Benzodiazepine, urine              Cutoff 200 ng/mL Methadone, urine                   Cutoff 300 ng/mL  The urine drug screen provides only a preliminary, unconfirmed analytical test result and should not be used for non-medical purposes. Clinical consideration and professional judgment should be applied to any positive drug screen result due to possible interfering substances. A more specific alternate chemical method must be used in order to obtain a confirmed analytical result. Gas chromatography / mass spectrometry (GC/MS) is the preferred confirm atory method. Performed at [facility], [address] [place], [place] [address]   ABO/Rh     Status: None   Collection Time: 05/29/21  5:57 PM  Result Value Ref Range   ABO/RH(D)      A POS Performed at [facility], [address] [place], [place] [address]   CBC     Status: Abnormal   Collection Time: 05/30/21  6:47 AM  Result Value Ref Range   WBC 8.5 4.0 - 10.5 K/uL   RBC 3.83 (L) 3.87 - 5.11 MIL/uL   Hemoglobin 10.2 (L) 12.0 - 15.0 g/dL   HCT 12.4 (L) 58.0 - 99.8 %   MCV 83.0 80.0 - 100.0 fL   MCH 26.6 26.0 - 34.0 pg   MCHC 32.1 30.0 - 36.0 g/dL   RDW 33.8 (H) 25.0 - 53.9 %   Platelets 182 150 - 400 K/uL   nRBC 0.0 0.0 - 0.2 %    Comment: Performed at [facility], [address], [place], [place] [address]    Assessment:   21 y.o. G1P1001 postpartum day # 0  Plan:    1) Acute blood loss anemia - hemodynamically stable and asymptomatic - po ferrous sulfate  2) Blood Type --/--/A POS Performed at [facility], [address] [place], [place] [address]  [phone]) / Ishmael Holter 5.27 (05/24 1424) / Varicella Immune  3) TDAP status declines  4) Feeding plan breast  5)  Education given regarding options for contraception, as well as compatibility with breast  feeding if applicable.  Patient plans on Nexplanon for contraception.  6) Disposition, discharge either later this evening or 1/9  Carie Caddy, CNM  [facility], [facility] 05/30/2021, 11:22 AM

## 2021-05-31 ENCOUNTER — Encounter: Payer: Self-pay | Admitting: Internal Medicine

## 2021-05-31 DIAGNOSIS — Z3A39 39 weeks gestation of pregnancy: Secondary | ICD-10-CM

## 2021-05-31 NOTE — Progress Notes (Signed)
Patient discharged home with family.  Discharge instructions, when to follow up, and prescriptions reviewed with patient.  Patient verbalized understanding. Patient will be escorted out by auxiliary.   

## 2021-05-31 NOTE — Lactation Note (Signed)
This note was copied from a baby's chart. Lactation Consultation Note  Patient Name: Kaitlyn Galloway Today's Date: 05/31/2021 Reason for consult: Initial assessment;Primapara;Term Age:22 hours  Initial lactation visit for mom who delivered 37hrs ago to her first baby. Mom reports initial BF difficulties, but feels things are better now. Baby is waking every 2-3 hours on her own, feeding for 20 or more minutes per feeding, and voiding/stooling. LC reviewed newborn feeding behaviors, early cues, feeding on demand, and cluster feeding. Discussed anticipated breast changes, management breast fullness and engorgement and nipple care. We discussed possible need to soften tissue prior to latching through hand expression or use of EBP a few minutes prior to feeding.  Mom has 2 pumps, 1 that plugs into the wall and one that is hands free. Mom educated on the difference between the two and when appropriate to use each one.  Reviewed 8-12 feedings per 24 hours going forward and ongoing output expectations. Outpatient lactation services offered and provided community breastfeeding support.  Maternal Data Has patient been taught Hand Expression?: Yes Does the patient have breastfeeding experience prior to this delivery?: No  Feeding Mother's Current Feeding Choice: Breast Milk  LATCH Score                    Lactation Tools Discussed/Used    Interventions Interventions: Breast feeding basics reviewed;Hand express;Reverse pressure;Pre-pump if needed;Education  Discharge Discharge Education: Engorgement and breast care;Warning signs for feeding baby;Outpatient recommendation Pump: Personal Kaitlyn Galloway, Kaitlyn Galloway)  Consult Status Consult Status: Complete    Danford Bad 05/31/2021, 9:33 AM

## 2021-05-31 NOTE — Discharge Instructions (Signed)

## 2021-06-04 ENCOUNTER — Encounter: Payer: BC Managed Care – PPO | Admitting: Obstetrics and Gynecology

## 2021-06-11 ENCOUNTER — Encounter: Payer: Self-pay | Admitting: Internal Medicine

## 2021-06-11 ENCOUNTER — Encounter: Payer: BC Managed Care – PPO | Admitting: Obstetrics and Gynecology

## 2021-06-14 ENCOUNTER — Encounter: Payer: Self-pay | Admitting: Licensed Practical Nurse

## 2021-06-14 ENCOUNTER — Ambulatory Visit (INDEPENDENT_AMBULATORY_CARE_PROVIDER_SITE_OTHER): Payer: BC Managed Care – PPO | Admitting: Licensed Practical Nurse

## 2021-06-14 ENCOUNTER — Other Ambulatory Visit: Payer: Self-pay

## 2021-06-14 NOTE — Progress Notes (Signed)
Postpartum Visit  Chief Complaint:  Chief Complaint  Patient presents with   Postpartum Care    History of Present Illness: Patient is a 21 y.o. G1P1001 presents for 2 week postpartum visit. Doing well.  Has good support at home.  Her partner is back to work, her mother is staying with her. Breastfeeding is going well, the infant only latches to her Right breast, she pumps and given EBM from the Left breasts. Sleep is as expected with a new baby. Mood is good. Bleeding has tapered to off to "barely there".  Desires another pregnancy in about 3 years. Had on stitch placed after birth, has no vaginal complaints-declines exam today.  Reviewed events of birth-shoulder dystocia, pleased with experience.  Date of delivery: 05/29/2021 Type of delivery: Vaginal delivery - Vacuum or forceps assisted  no Episiotomy No.  Laceration: no  Pregnancy or labor problems:  shoulder dystocia  Any problems since the delivery:  no  Newborn Details:  SINGLETON :  1. Baby's name: [place]. Birth weight: 7lbs 0.5oz Maternal Details:  Breast Feeding:  yes Post partum depression/anxiety noted:  no Edinburgh Post-Partum Depression Score:  7  Date of last PAP: 10/13/2020  normal   Past Medical History:  Diagnosis Date   Asthma    DR. PRINGLE   Heart murmur    INNOCENT   Leakage of amniotic fluid 05/04/2021    Past Surgical History:  Procedure Laterality Date   NO PAST SURGERIES      Prior to Admission medications   Medication Sig Start Date End Date Taking? Authorizing Provider  albuterol (PROVENTIL HFA;VENTOLIN HFA) 108 (90 Base) MCG/ACT inhaler Inhale into the lungs every 6 (six) hours as needed for wheezing or shortness of breath.    [provider]  Prenatal Vit-Iron Carbonyl-FA (PRENATABS RX) 29-1 MG TABS Take 1 tablet by mouth daily. 10/13/20   Zipporah Plants, CNM    No Known Allergies   Social History   Socioeconomic History   Marital status: Single    Spouse name: Not on file    Number of children: 0   Years of education: 12   Highest education level: Not on file  Occupational History   Occupation: STUDENT    Comment: [employer]  Tobacco Use   Smoking status: Former    Types: E-cigarettes   Smokeless tobacco: Never  Building services engineer Use: Former  Substance and Sexual Activity   Alcohol use: Not Currently   Drug use: Not Currently    Types: Marijuana   Sexual activity: Yes    Birth control/protection: None, Implant  Other Topics Concern   Not on file  Social History Narrative   Lives in [place]; not smoking now; no alcohol; 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   Social Determinants of Health   Financial Resource Strain: Not on file  Food Insecurity: Not on file  Transportation Needs: Not on file  Physical Activity: Not on file  Stress: Not on file  Social Connections: Not on file  Intimate Partner Violence: Not on file    Family History  Problem Relation Age of Onset   Hypertension Maternal Aunt    Cancer Maternal Uncle 60   Hypertension Maternal Grandmother    Cancer Maternal Grandmother 51   Breast cancer Other 60   Cancer Other 90       COLON    ROS   Physical Exam BP 122/74    Ht 5\' 3"  (1.6 m)    Wt 180  lb (81.6 kg)    Breastfeeding Yes    BMI 31.89 kg/m   Physical Exam Pulmonary:     Effort: Pulmonary effort is normal.  Chest:     Comments: Breast: lactating, soft, no masses, discoloration/redness or tenderness  Abdominal:     General: Abdomen is flat.     Comments: Uterus not palpable   Neurological:     Mental Status: She is alert.  Skin:    General: Skin is warm.  Psychiatric:        Mood and Affect: Mood normal.      Assessment: 21 y.o. G1P1001 presenting for 6 week postpartum visit  Plan: Problem List Items Addressed This Visit   None    1) Contraception Education given regarding options for contraception, including  Nexplanon .  2)  Pap Not due  3) Patient underwent screening for  postpartum depression with NO  concerns noted.  4) Sexual and physical activity reviewed.   5) Follow up 4 weeks for 6wk PP visit and Nexplanon insertion  Carie Caddy, CNM  [address], [facility]  06/14/21  4:20 PM

## 2021-06-18 ENCOUNTER — Inpatient Hospital Stay: Payer: Self-pay

## 2021-06-18 ENCOUNTER — Inpatient Hospital Stay: Payer: Self-pay | Attending: Internal Medicine

## 2021-06-18 ENCOUNTER — Inpatient Hospital Stay: Payer: BC Managed Care – PPO | Admitting: Nurse Practitioner

## 2021-06-18 ENCOUNTER — Encounter: Payer: Self-pay | Admitting: Internal Medicine

## 2021-07-12 ENCOUNTER — Ambulatory Visit (INDEPENDENT_AMBULATORY_CARE_PROVIDER_SITE_OTHER): Payer: BC Managed Care – PPO | Admitting: Obstetrics

## 2021-07-12 ENCOUNTER — Other Ambulatory Visit: Payer: Self-pay

## 2021-07-12 ENCOUNTER — Encounter: Payer: Self-pay | Admitting: Obstetrics

## 2021-07-12 DIAGNOSIS — Z975 Presence of (intrauterine) contraceptive device: Secondary | ICD-10-CM

## 2021-07-12 DIAGNOSIS — Z30017 Encounter for initial prescription of implantable subdermal contraceptive: Secondary | ICD-10-CM | POA: Diagnosis not present

## 2021-07-12 NOTE — Progress Notes (Signed)
Postpartum Visit  Chief Complaint:  Chief Complaint  Patient presents with   Routine Prenatal Visit   Contraception    History of Present Illness: Patient is a 21 y.o. G1P1001 presents for postpartum visit.she is breastfeeding. She denies any depression. Used nitrous oxide for her labor pain control. She wants to have Nexplanoin placed today.  Date of delivery: 05/29/2021 Type of delivery: Vaginal delivery - Vacuum or forceps assisted  no Episiotomy No.  Laceration: yes - small left labial Pregnancy or labor problems:  no Any problems since the delivery:  no  Newborn Details:  SINGLETON :  1. Baby's name: girl. Birth weight: 7lbs 1 oz Maternal Details:  Breast Feeding:  yes Post partum depression/anxiety noted:  no Edinburgh Post-Partum Depression Score:  4  Date of last PAP: 2021  normal   Past Medical History:  Diagnosis Date   Asthma    DR. PRINGLE   Heart murmur    INNOCENT   Leakage of amniotic fluid 05/04/2021    Past Surgical History:  Procedure Laterality Date   NO PAST SURGERIES      Prior to Admission medications   Medication Sig Start Date End Date Taking? Authorizing Provider  albuterol (PROVENTIL HFA;VENTOLIN HFA) 108 (90 Base) MCG/ACT inhaler Inhale into the lungs every 6 (six) hours as needed for wheezing or shortness of breath.    [provider]  Prenatal Vit-Iron Carbonyl-FA (PRENATABS RX) 29-1 MG TABS Take 1 tablet by mouth daily. 10/13/20   Zipporah Plants, CNM    No Known Allergies   Social History   Socioeconomic History   Marital status: Single    Spouse name: Not on file   Number of children: 0   Years of education: 12   Highest education level: Not on file  Occupational History   Occupation: STUDENT    Comment: [employer]  Tobacco Use   Smoking status: Former    Types: E-cigarettes   Smokeless tobacco: Never  Building services engineer Use: Former  Substance and Sexual Activity   Alcohol use: Not Currently   Drug  use: Not Currently    Types: Marijuana   Sexual activity: Yes    Birth control/protection: None, Implant  Other Topics Concern   Not on file  Social History Narrative   Lives in [place]; not smoking now; no alcohol; 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   Social Determinants of Health   Financial Resource Strain: Not on file  Food Insecurity: Not on file  Transportation Needs: Not on file  Physical Activity: Not on file  Stress: Not on file  Social Connections: Not on file  Intimate Partner Violence: Not on file    Family History  Problem Relation Age of Onset   Hypertension Maternal Aunt    Cancer Maternal Uncle 60   Hypertension Maternal Grandmother    Cancer Maternal Grandmother 36   Breast cancer Other 60   Cancer Other 90       COLON    ROS   Physical Exam BP 122/70    Ht 5\' 3"  (1.6 m)    Wt 172 lb (78 kg)    Breastfeeding Yes    BMI 30.47 kg/m   OBGyn Exam   Female Chaperone present during breast and/or pelvic exam.  Assessment: 21 y.o. G1P1001 presenting for 6 week postpartum visit  Plan: Problem List Items Addressed This Visit       Other   Nexplanon in place   Other Visit Diagnoses  Postpartum care and examination of lactating mother    -  Primary   Nexplanon insertion            1) Contraception Education given regarding options for contraception, including  nexplanon placement today .     GYNECOLOGY PROCEDURE NOTE  Patient is a 21 y.o. G1P1001 presenting for Nexplanon insertion as her desires means of contraception.  She provided informed consent, signed copy in the chart, time out was performed. Pregnancy test was negative, with self reported LMP of No LMP recorded.  She understands that Nexplanon is a progesterone only therapy, and that patients often patients have irregular and unpredictable vaginal bleeding or amenorrhea. She understands that other side effects are possible related to systemic progesterone, including but  not limited to, headaches, breast tenderness, nausea, and irritability. While effective at preventing pregnancy long acting reversible contraceptives do not prevent transmission of sexually transmitted diseases and use of barrier methods for this purpose was discussed. The placement procedure for Nexplanon was reviewed with the patient in detail including risks of nerve injury, infection, bleeding and injury to other muscles or tendons. She understands that the Nexplanon implant is good for 3 years and needs to be removed at the end of that time.  She understands that Nexplanon is an extremely effective option for contraception, with failure rate of <1%. This information is reviewed today and all questions were answered. Informed consent was obtained, both verbally and written.   The patient is healthy and has no contraindications to Implanon use. Urine pregnancy test was performed today and was negative.  Procedure Appropriate time out taken.  Patient placed in dorsal supine with her left arm above head, elbow flexed at 90 degrees, arm resting on examination table.  The bicipital grove was palpated and site 8-10cm proximal to the medial epicondyle was indentified . The insertion site was prepped with a two betadine swabs and then injected with 1.5 cc of 1% lidocaine without epinephrine.  Nexplanon removed form sterile blister packaging,  Device confirmed in needle, before inserting full length of needle, tenting up the skin as the needle was advance.  The drug eluting rod was then deployed by pulling back the slider per the manufactures recommendation.  The implant was palpable by the clinician as well as the patient.  The insertion site covered dressed with a band aid before applying  a kerlex bandage pressure dressing..Minimal blood loss was noted during the procedure.  The patientt tolerated the procedure well.   She was instructed to wear the bandage for 24 hours, call with any signs of infection.  She  was given the Implanon card and instructed to have the rod removed in 3 years.  Charge [phone] for nexplanon device, CPT [id number] for procedure J2001 for lidocaine administration Modifer 25, plus Modifer 79 is done during a global billing visit    2)  Pap - ASCCP guidelines and rational discussed.  Patient opts for annual screening interval  3) Patient underwent screening for postpartum depression with no concerns noted.  4) Follow up 1 year for routine annual exam  [name], CNM  07/12/2021 11:12 AM   07/12/2021 11:09 AM

## 2021-09-14 ENCOUNTER — Encounter: Payer: Self-pay | Admitting: Internal Medicine

## 2021-12-06 ENCOUNTER — Encounter: Payer: Self-pay | Admitting: Internal Medicine

## 2021-12-15 ENCOUNTER — Encounter: Payer: Self-pay | Admitting: Internal Medicine

## 2021-12-21 ENCOUNTER — Encounter: Payer: Self-pay | Admitting: Advanced Practice Midwife

## 2021-12-21 ENCOUNTER — Ambulatory Visit (INDEPENDENT_AMBULATORY_CARE_PROVIDER_SITE_OTHER): Payer: Medicaid Other | Admitting: Advanced Practice Midwife

## 2021-12-21 VITALS — BP 118/74 | Ht 63.0 in | Wt 186.0 lb

## 2021-12-21 DIAGNOSIS — Z30011 Encounter for initial prescription of contraceptive pills: Secondary | ICD-10-CM

## 2021-12-21 DIAGNOSIS — Z3046 Encounter for surveillance of implantable subdermal contraceptive: Secondary | ICD-10-CM

## 2021-12-21 DIAGNOSIS — R102 Pelvic and perineal pain: Secondary | ICD-10-CM | POA: Diagnosis not present

## 2021-12-21 MED ORDER — LEVONORGESTREL-ETHINYL ESTRAD 0.1-20 MG-MCG PO TABS: 1.0000 | ORAL_TABLET | Freq: Every day | ORAL | 4 refills | Status: DC

## 2021-12-21 NOTE — Progress Notes (Signed)
   GYNECOLOGY PROCEDURE NOTE  Patient reports daily pelvic pain with Nexplanon. She had a similar reaction with previous implant. She requests removal of device and she would like to take OCPs. She is still breastfeeding at 7 months postpartum. She reports a good milk supply. Nexplanon removal discussed in detail.  Risks of infection, bleeding, nerve injury all reviewed.  Patient understands risks and desires to proceed.  Verbal consent obtained.  Patient is certain she wants the Nexplanon removed.  All questions answered.  Review of Systems  Constitutional:  Negative for chills and fever.  HENT:  Negative for congestion, ear discharge, ear pain, hearing loss, sinus pain and sore throat.   Eyes:  Negative for blurred vision and double vision.  Respiratory:  Negative for cough, shortness of breath and wheezing.   Cardiovascular:  Negative for chest pain, palpitations and leg swelling.  Gastrointestinal:  Positive for abdominal pain. Negative for blood in stool, constipation, diarrhea, heartburn, melena, nausea and vomiting.  Genitourinary:  Negative for dysuria, flank pain, frequency, hematuria and urgency.  Musculoskeletal:  Negative for back pain, joint pain and myalgias.  Skin:  Negative for itching and rash.  Neurological:  Negative for dizziness, tingling, tremors, sensory change, speech change, focal weakness, seizures, loss of consciousness, weakness and headaches.  Endo/Heme/Allergies:  Negative for environmental allergies. Does not bruise/bleed easily.  Psychiatric/Behavioral:  Negative for depression, hallucinations, memory loss, substance abuse and suicidal ideas. The patient is not nervous/anxious and does not have insomnia.    Vital Signs: BP 118/74   Ht 5\' 3"  (1.6 m)   Wt 186 lb (84.4 kg)   BMI 32.95 kg/m  Constitutional: Well nourished, well developed female in no acute distress.  HEENT: normal Skin: Warm and dry.   Extremity:  no edema   Respiratory:  Normal respiratory  effort Abdomen: soft, nontender, nondistended, no abnormal masses Back: no CVAT Neuro: DTRs 2+, Cranial nerves grossly intact Psych: Alert and Oriented x3. No memory deficits. Normal mood and affect.    Procedure: Patient placed in dorsal supine with left arm above head, elbow flexed at 90 degrees, arm resting on examination table.  Nexplanon identified without problems.  Betadine scrub x3.  1 ml of 1% lidocaine injected under Nexplanon device without problems.  Sterile gloves applied.  Small 0.5cm incision made at distal tip of Nexplanon device with 11 blade scalpel.  Nexplanon brought to incision and grasped with a small kelly clamp.  Nexplanon removed intact without problems.  Pressure applied to incision.  Hemostasis obtained.  Steri-strips applied, followed by bandage and compression dressing.  Patient tolerated procedure well.  No complications.   Assessment: 21 y.o. year old female now s/p uncomplicated Nexplanon removal.  Plan: 1.  Patient given post procedure precautions and asked to call for fever, chills, redness or drainage from her incision, bleeding from incision.  She understands she will likely have a small bruise near site of removal and can remove bandage tomorrow and steri-strips in approximately 1 week.  2) Contraception: Aviane OCP Rx   21, CNM [facility] 12/21/21, 4:53 PM    [id number] for lidocaine block, [id number] for nexplanon removal

## 2021-12-23 ENCOUNTER — Encounter: Payer: Self-pay | Admitting: Advanced Practice Midwife

## 2022-05-06 IMAGING — US US OB COMP +14 WK
1 series · 13 of 28 positions shown · non-contrast
Comparison: none

CLINICAL DATA: Second trimester pregnancy for fetal anatomy survey.

EXAM:
OBSTETRICAL ULTRASOUND >14 WKS

[Series 1: us ob comp + 14 wk · 13 of 74 slices shown]
[im 3/74]
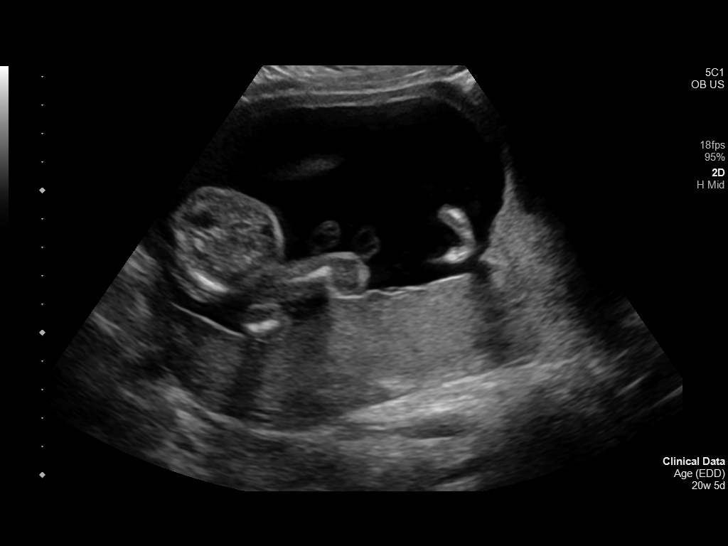
[im 9/74]
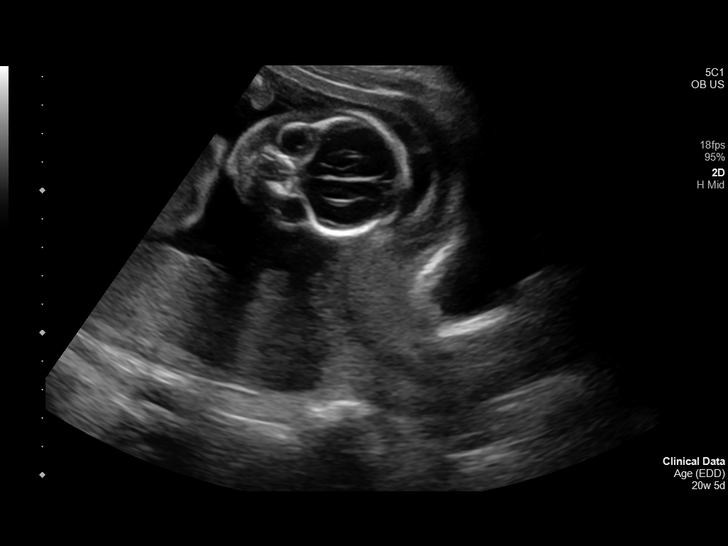
[im 14/74]
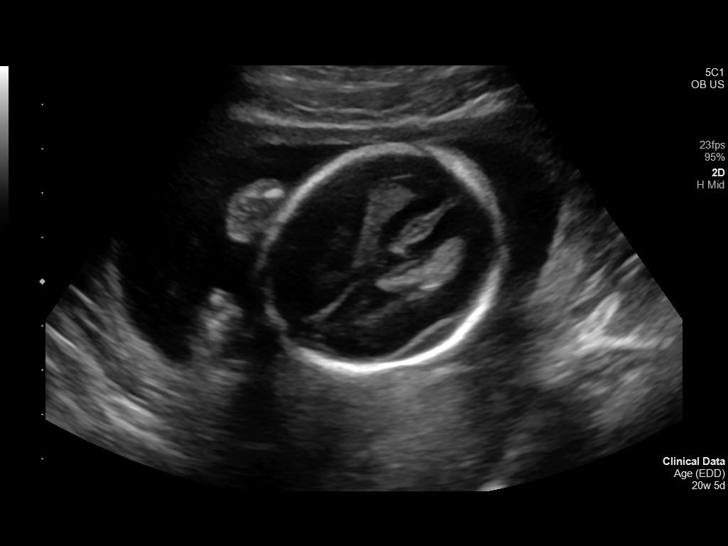
[im 19/74]
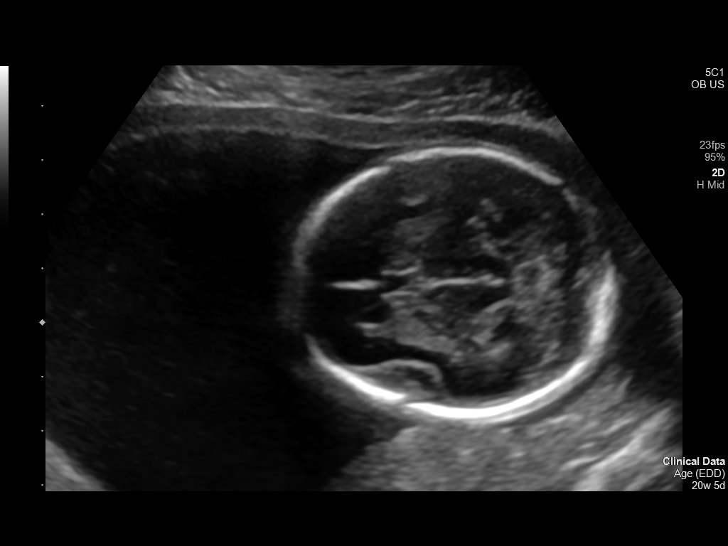
[im 25/74]
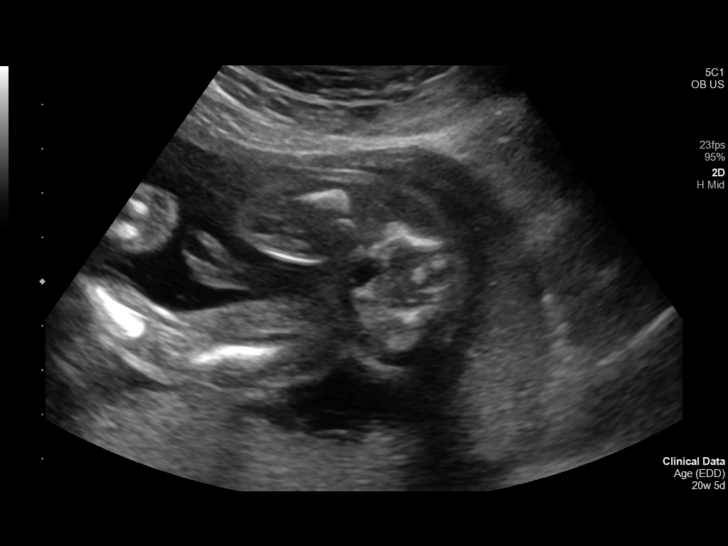
[im 30/74]
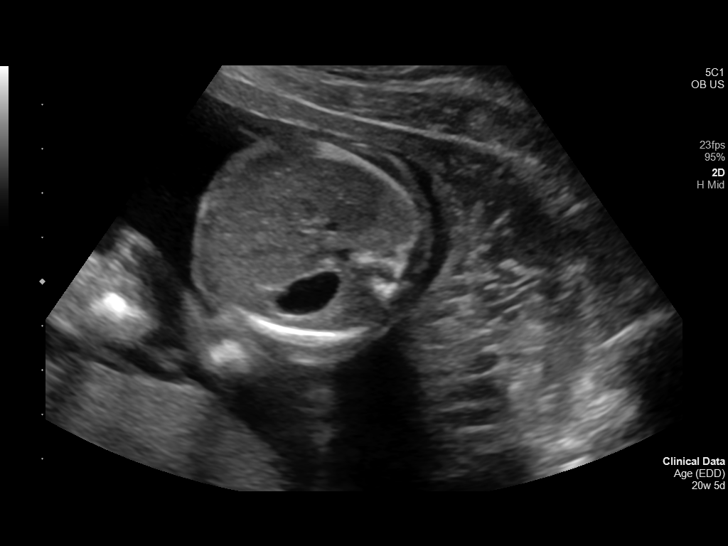
[im 38/74]
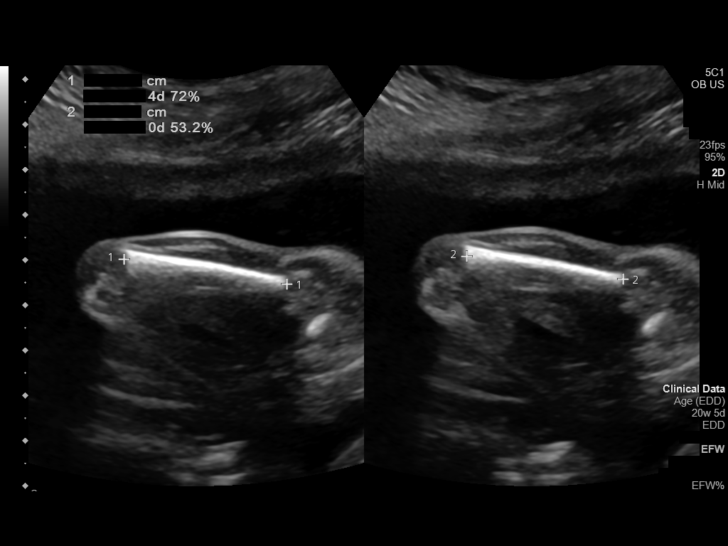
[im 44/74]
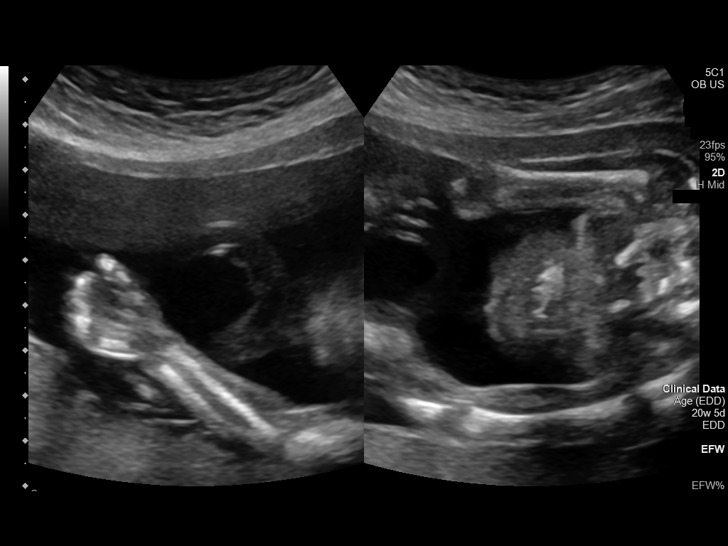
[im 49/74]
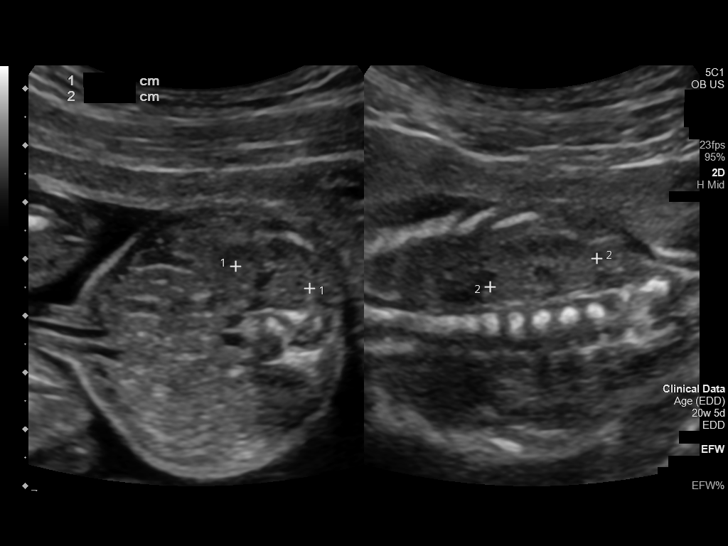
[im 55/74]
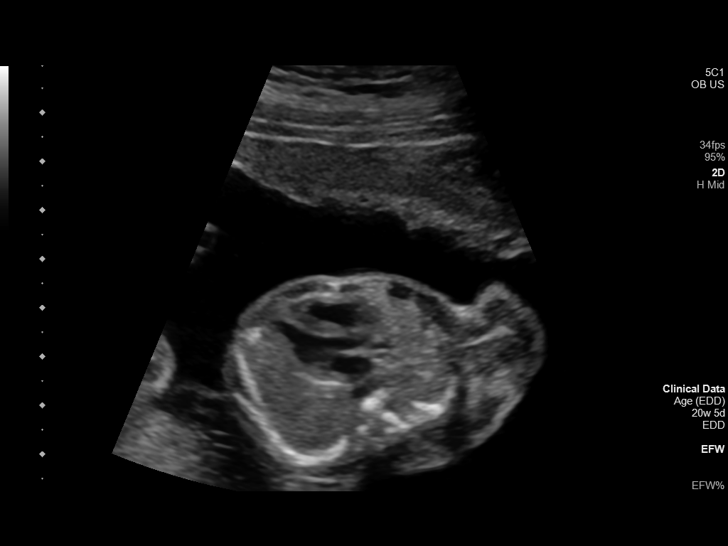
[im 60/74]
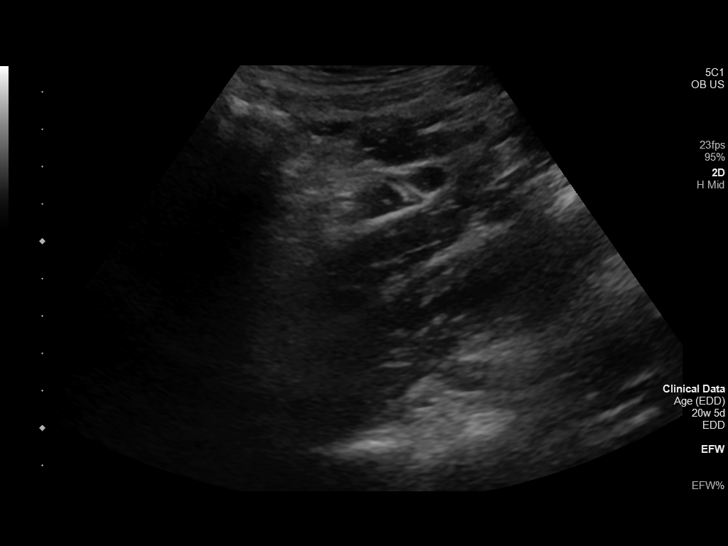
[im 65/74]
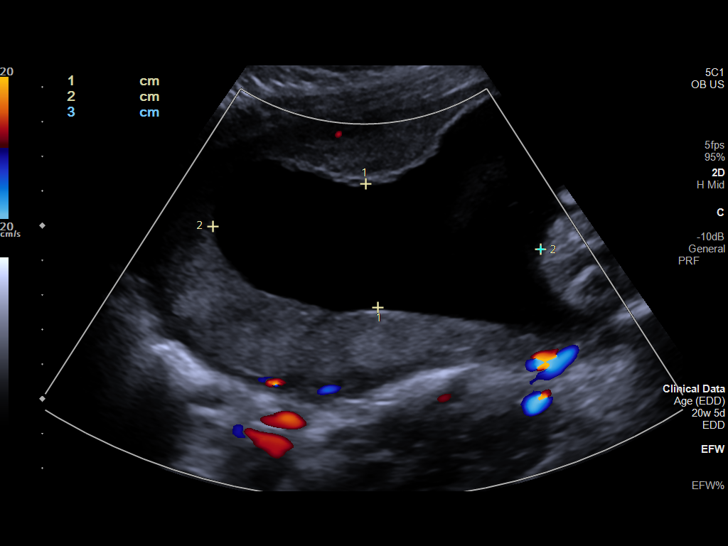
[im 71/74]
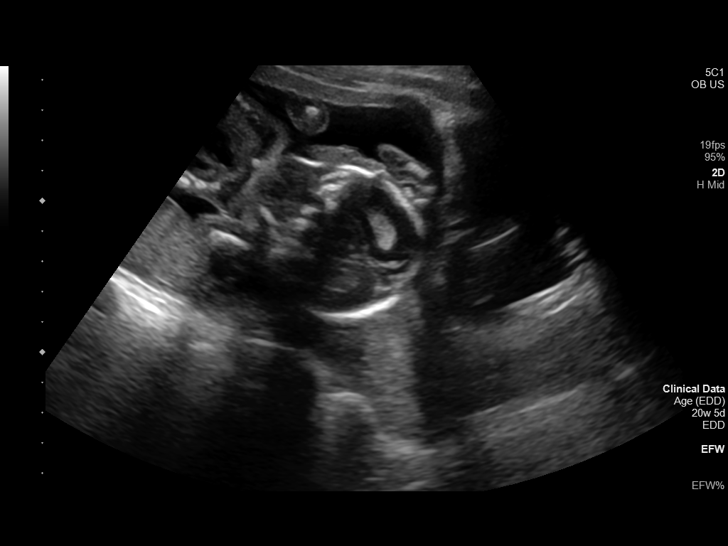

[13 of 28 positions shown; findings below may reference images not displayed]

FINDINGS: Number of Fetuses: 1

Heart Rate:  148 bpm

Movement: Yes

Presentation: Cephalic

Previa: No

Placental Location: Posterior

Amniotic Fluid (Subjective): Within normal limits

Amniotic Fluid (Objective):

Vertical pocket = 3.6cm

FETAL BIOMETRY

BPD: 4.9cm 20w 5d

HC:   17.7cm 20w 1d

AC:   16.0cm 21w 1d

FL:   3.6cm 21w 2d

Current Mean GA: 21w 0d US EDC: 05/31/2021

Assigned GA:  20w 5d Assigned EDC: 06/02/2021

FETAL ANATOMY

Lateral Ventricles: Appears normal

Thalami/CSP: Appears normal

Posterior Fossa:  Appears normal

Nuchal Region: Appears normal   NFT= N/A > 20 WKS

Upper Lip: Appears normal

Spine: Appears normal

4 Chamber Heart on Left: Appears normal

LVOT: Appears normal

RVOT: Appears normal

Stomach on Left: Appears normal

3 Vessel Cord: Appears normal

Cord Insertion site: Appears normal

Kidneys: Appears normal

Bladder: Appears normal

Extremities: Appears normal

Maternal Findings:

Cervix:  4.1 cm TA
IMPRESSION: Assigned GA currently 20 weeks 5 days.  Appropriate fetal growth.

Unremarkable fetal anatomic survey.  No fetal anomalies identified.

## 2023-01-30 ENCOUNTER — Ambulatory Visit (INDEPENDENT_AMBULATORY_CARE_PROVIDER_SITE_OTHER): Payer: BC Managed Care – PPO

## 2023-01-30 ENCOUNTER — Encounter: Payer: Self-pay | Admitting: Internal Medicine

## 2023-01-30 VITALS — BP 117/71 | HR 82 | Ht 63.0 in | Wt 188.4 lb

## 2023-01-30 DIAGNOSIS — N912 Amenorrhea, unspecified: Secondary | ICD-10-CM

## 2023-01-30 DIAGNOSIS — Z3201 Encounter for pregnancy test, result positive: Secondary | ICD-10-CM | POA: Diagnosis not present

## 2023-01-30 LAB — POCT URINE PREGNANCY: Preg Test, Ur: POSITIVE — AB

## 2023-01-30 NOTE — Progress Notes (Signed)
    NURSE VISIT NOTE  Subjective:    Patient ID: [place], female    DOB: 08-02-2000, 22 y.o.   MRN: [id number]  HPI  Patient is a 22 y.o. G62P1001 female who presents for evaluation of amenorrhea. She believes she could be pregnant. Pregnancy is desired. Sexual Activity: single partner, contraception: none. Current symptoms also include: frequent urination, nausea, and positive home pregnancy test. Last period was abnormal. Patient reports that she believes her LMP was 11/28/22 but states that in August she had  spotting that lasted 7 days and then in September reports spotting for 2 days.     Objective:    BP 117/71   Pulse 82   Ht 5\' 3"  (1.6 m)   Wt 188 lb 6.4 oz (85.5 kg)   LMP 11/28/2022 (Within Weeks)   Breastfeeding Yes   BMI 33.37 kg/m   Lab Review  Results for orders placed or performed in visit on 01/30/23  POCT urine pregnancy  Result Value Ref Range   Preg Test, Ur Positive (A) Negative    Assessment:   1. Absence of menstruation     Plan:   Pregnancy Test: Positive  Estimated Date of Delivery: None noted. If LMP is accurate on 11/28/22 then patient EDD is 09/04/23 BP Cuff Measurement taken. Cuff Size Adult Large Encouraged well-balanced diet, plenty of rest when needed, pre-natal vitamins daily and walking for exercise.  Discussed self-help for nausea, avoiding OTC medications until consulting provider or pharmacist, other than Tylenol as needed, minimal caffeine (1-2 cups daily) and avoiding alcohol.   She will schedule her nurse visit @ 7-[redacted] wks pregnant, u/s for dating @10  wk, and NOB visit at [redacted] wk pregnant.     Beta Quant Ordered per  Brennan Bailey, MD.   Fonda Kinder, CMA

## 2023-01-30 NOTE — Patient Instructions (Signed)
CONGRATULATIONS!! First Trimester of Pregnancy  The first trimester of pregnancy starts on the first day of your last menstrual period until the end of week 12. This is also called months 1 through 3 of pregnancy. Body changes during your first trimester Your body goes through many changes during pregnancy. The changes usually return to normal after your baby is born. Physical changes You may gain or lose weight. Your breasts may grow larger and hurt. The area around your nipples may get darker. Dark spots or blotches may develop on your face. You may have changes in your hair. Health changes You may feel like you might vomit (nauseous), and you may vomit. You may have heartburn. You may have headaches. You may have trouble pooping (constipation). Your gums may bleed. Other changes You may get tired easily. You may pee (urinate) more often. Your menstrual periods will stop. You may not feel hungry. You may want to eat certain kinds of food. You may have changes in your emotions from day to day. You may have more dreams. Follow these instructions at home: Medicines Take over-the-counter and prescription medicines only as told by your doctor. Some medicines are not safe during pregnancy. Take a prenatal vitamin that contains at least 600 micrograms (mcg) of folic acid. Eating and drinking Eat healthy meals that include: Fresh fruits and vegetables. Whole grains. Good sources of protein, such as meat, eggs, or tofu. Low-fat dairy products. Avoid raw meat and unpasteurized juice, milk, and cheese. If you feel like you may vomit, or you vomit: Eat 4 or 5 small meals a day instead of 3 large meals. Try eating a few soda crackers. Drink liquids between meals instead of during meals. You may need to take these actions to prevent or treat trouble pooping: Drink enough fluids to keep your pee (urine) pale yellow. Eat foods that are high in fiber. These include beans, whole grains,  and fresh fruits and vegetables. Limit foods that are high in fat and sugar. These include fried or sweet foods. Activity Exercise only as told by your doctor. Most people can do their usual exercise routine during pregnancy. Stop exercising if you have cramps or pain in your lower belly (abdomen) or low back. Do not exercise if it is too hot or too humid, or if you are in a place of great height (high altitude). Avoid heavy lifting. If you choose to, you may have sex unless your doctor tells you not to. Relieving pain and discomfort Wear a good support bra if your breasts are sore. Rest with your legs raised (elevated) if you have leg cramps or low back pain. If you have bulging veins (varicose veins) in your legs: Wear support hose as told by your doctor. Raise your feet for 15 minutes, 3-4 times a day. Limit salt in your food. Safety Wear your seat belt at all times when you are in a car. Talk with your doctor if someone is hurting you or yelling at you. Talk with your doctor if you are feeling sad or have thoughts of hurting yourself. Lifestyle Do not use hot tubs, steam rooms, or saunas. Do not douche. Do not use tampons or scented sanitary pads. Do not use herbal medicines, illegal drugs, or medicines that are not approved by your doctor. Do not drink alcohol. Do not smoke or use any products that contain nicotine or tobacco. If you need help quitting, ask your doctor. Avoid cat litter boxes and soil that is used by cats. These  carry germs that can cause harm to the baby and can cause a loss of your baby by miscarriage or stillbirth. General instructions Keep all follow-up visits. This is important. Ask for help if you need counseling or if you need help with nutrition. Your doctor can give you advice or tell you where to go for help. Visit your dentist. At home, brush your teeth with a soft toothbrush. Floss gently. Write down your questions. Take them to your prenatal  visits. Where to find more information American Pregnancy Association: americanpregnancy.org [employer]: www.acog.org Office on Women's Health: [employer] Contact a doctor if: You are dizzy. You have a fever. You have mild cramps or pressure in your lower belly. You have a nagging pain in your belly area. You continue to feel like you may vomit, you vomit, or you have watery poop (diarrhea) for 24 hours or longer. You have a bad-smelling fluid coming from your vagina. You have pain when you pee. You are exposed to a disease that spreads from person to person, such as chickenpox, measles, Zika virus, HIV, or hepatitis. Get help right away if: You have spotting or bleeding from your vagina. You have very bad belly cramping or pain. You have shortness of breath or chest pain. You have any kind of injury, such as from a fall or a car crash. You have new or increased pain, swelling, or redness in an arm or leg. Summary The first trimester of pregnancy starts on the first day of your last menstrual period until the end of week 12 (months 1 through 3). Eat 4 or 5 small meals a day instead of 3 large meals. Do not smoke or use any products that contain nicotine or tobacco. If you need help quitting, ask your doctor. Keep all follow-up visits. This information is not intended to replace advice given to you by your health care provider. Make sure you discuss any questions you have with your health care provider. Document Revised: 10/16/2019 Document Reviewed: 08/22/2019 Elsevier Patient Education  2024 [employer].

## 2023-02-01 ENCOUNTER — Ambulatory Visit: Payer: Self-pay

## 2023-02-10 ENCOUNTER — Ambulatory Visit (INDEPENDENT_AMBULATORY_CARE_PROVIDER_SITE_OTHER): Payer: BC Managed Care – PPO

## 2023-02-10 DIAGNOSIS — Z3689 Encounter for other specified antenatal screening: Secondary | ICD-10-CM

## 2023-02-10 DIAGNOSIS — Z3480 Encounter for supervision of other normal pregnancy, unspecified trimester: Secondary | ICD-10-CM | POA: Insufficient documentation

## 2023-02-10 NOTE — Patient Instructions (Signed)
First Trimester of Pregnancy  The first trimester of pregnancy starts on the first day of your last menstrual period until the end of week 12. This is also called months 1 through 3 of pregnancy. Body changes during your first trimester Your body goes through many changes during pregnancy. The changes usually return to normal after your baby is born. Physical changes You may gain or lose weight. Your breasts may grow larger and hurt. The area around your nipples may get darker. Dark spots or blotches may develop on your face. You may have changes in your hair. Health changes You may feel like you might vomit (nauseous), and you may vomit. You may have heartburn. You may have headaches. You may have trouble pooping (constipation). Your gums may bleed. Other changes You may get tired easily. You may pee (urinate) more often. Your menstrual periods will stop. You may not feel hungry. You may want to eat certain kinds of food. You may have changes in your emotions from day to day. You may have more dreams. Follow these instructions at home: Medicines Take over-the-counter and prescription medicines only as told by your doctor. Some medicines are not safe during pregnancy. Take a prenatal vitamin that contains at least 600 micrograms (mcg) of folic acid. Eating and drinking Eat healthy meals that include: Fresh fruits and vegetables. Whole grains. Good sources of protein, such as meat, eggs, or tofu. Low-fat dairy products. Avoid raw meat and unpasteurized juice, milk, and cheese. If you feel like you may vomit, or you vomit: Eat 4 or 5 small meals a day instead of 3 large meals. Try eating a few soda crackers. Drink liquids between meals instead of during meals. You may need to take these actions to prevent or treat trouble pooping: Drink enough fluids to keep your pee (urine) pale yellow. Eat foods that are high in fiber. These include beans, whole grains, and fresh fruits and  vegetables. Limit foods that are high in fat and sugar. These include fried or sweet foods. Activity Exercise only as told by your doctor. Most people can do their usual exercise routine during pregnancy. Stop exercising if you have cramps or pain in your lower belly (abdomen) or low back. Do not exercise if it is too hot or too humid, or if you are in a place of great height (high altitude). Avoid heavy lifting. If you choose to, you may have sex unless your doctor tells you not to. Relieving pain and discomfort Wear a good support bra if your breasts are sore. Rest with your legs raised (elevated) if you have leg cramps or low back pain. If you have bulging veins (varicose veins) in your legs: Wear support hose as told by your doctor. Raise your feet for 15 minutes, 3-4 times a day. Limit salt in your food. Safety Wear your seat belt at all times when you are in a car. Talk with your doctor if someone is hurting you or yelling at you. Talk with your doctor if you are feeling sad or have thoughts of hurting yourself. Lifestyle Do not use hot tubs, steam rooms, or saunas. Do not douche. Do not use tampons or scented sanitary pads. Do not use herbal medicines, illegal drugs, or medicines that are not approved by your doctor. Do not drink alcohol. Do not smoke or use any products that contain nicotine or tobacco. If you need help quitting, ask your doctor. Avoid cat litter boxes and soil that is used by cats. These carry   germs that can cause harm to the baby and can cause a loss of your baby by miscarriage or stillbirth. General instructions Keep all follow-up visits. This is important. Ask for help if you need counseling or if you need help with nutrition. Your doctor can give you advice or tell you where to go for help. Visit your dentist. At home, brush your teeth with a soft toothbrush. Floss gently. Write down your questions. Take them to your prenatal visits. Where to find more  information American Pregnancy Association: americanpregnancy.org American College of Obstetricians and Gynecologists: www.acog.org Office on Women's Health: womenshealth.gov/pregnancy Contact a doctor if: You are dizzy. You have a fever. You have mild cramps or pressure in your lower belly. You have a nagging pain in your belly area. You continue to feel like you may vomit, you vomit, or you have watery poop (diarrhea) for 24 hours or longer. You have a bad-smelling fluid coming from your vagina. You have pain when you pee. You are exposed to a disease that spreads from person to person, such as chickenpox, measles, Zika virus, HIV, or hepatitis. Get help right away if: You have spotting or bleeding from your vagina. You have very bad belly cramping or pain. You have shortness of breath or chest pain. You have any kind of injury, such as from a fall or a car crash. You have new or increased pain, swelling, or redness in an arm or leg. Summary The first trimester of pregnancy starts on the first day of your last menstrual period until the end of week 12 (months 1 through 3). Eat 4 or 5 small meals a day instead of 3 large meals. Do not smoke or use any products that contain nicotine or tobacco. If you need help quitting, ask your doctor. Keep all follow-up visits. This information is not intended to replace advice given to you by your health care provider. Make sure you discuss any questions you have with your health care provider. Document Revised: 10/16/2019 Document Reviewed: 08/22/2019 Elsevier Patient Education  2024 Elsevier Inc. Commonly Asked Questions During Pregnancy  Cats: A parasite can be excreted in cat feces.  To avoid exposure you need to have another person empty the little box.  If you must empty the litter box you will need to wear gloves.  Wash your hands after handling your cat.  This parasite can also be found in raw or undercooked meat so this should also be  avoided.  Colds, Sore Throats, Flu: Please check your medication sheet to see what you can take for symptoms.  If your symptoms are unrelieved by these medications please call the office.  Dental Work: Most any dental work your dentist recommends is permitted.  X-rays should only be taken during the first trimester if absolutely necessary.  Your abdomen should be shielded with a lead apron during all x-rays.  Please notify your provider prior to receiving any x-rays.  Novocaine is fine; gas is not recommended.  If your dentist requires a note from us prior to dental work please call the office and we will provide one for you.  Exercise: Exercise is an important part of staying healthy during your pregnancy.  You may continue most exercises you were accustomed to prior to pregnancy.  Later in your pregnancy you will most likely notice you have difficulty with activities requiring balance like riding a bicycle.  It is important that you listen to your body and avoid activities that put you at a higher   risk of falling.  Adequate rest and staying well hydrated are a must!  If you have questions about the safety of specific activities ask your provider.    Exposure to Children with illness: Try to avoid obvious exposure; report any symptoms to us when noted,  If you have chicken pos, red measles or mumps, you should be immune to these diseases.   Please do not take any vaccines while pregnant unless you have checked with your OB provider.  Fetal Movement: After 28 weeks we recommend you do "kick counts" twice daily.  Lie or sit down in a calm quiet environment and count your baby movements "kicks".  You should feel your baby at least 10 times per hour.  If you have not felt 10 kicks within the first hour get up, walk around and have something sweet to eat or drink then repeat for an additional hour.  If count remains less than 10 per hour notify your provider.  Fumigating: Follow your pest control agent's  advice as to how long to stay out of your home.  Ventilate the area well before re-entering.  Hemorrhoids:   Most over-the-counter preparations can be used during pregnancy.  Check your medication to see what is safe to use.  It is important to use a stool softener or fiber in your diet and to drink lots of liquids.  If hemorrhoids seem to be getting worse please call the office.   Hot Tubs:  Hot tubs Jacuzzis and saunas are not recommended while pregnant.  These increase your internal body temperature and should be avoided.  Intercourse:  Sexual intercourse is safe during pregnancy as long as you are comfortable, unless otherwise advised by your provider.  Spotting may occur after intercourse; report any bright red bleeding that is heavier than spotting.  Labor:  If you know that you are in labor, please go to the hospital.  If you are unsure, please call the office and let us help you decide what to do.  Lifting, straining, etc:  If your job requires heavy lifting or straining please check with your provider for any limitations.  Generally, you should not lift items heavier than that you can lift simply with your hands and arms (no back muscles)  Painting:  Paint fumes do not harm your pregnancy, but may make you ill and should be avoided if possible.  Latex or water based paints have less odor than oils.  Use adequate ventilation while painting.  Permanents & Hair Color:  Chemicals in hair dyes are not recommended as they cause increase hair dryness which can increase hair loss during pregnancy.  " Highlighting" and permanents are allowed.  Dye may be absorbed differently and permanents may not hold as well during pregnancy.  Sunbathing:  Use a sunscreen, as skin burns easily during pregnancy.  Drink plenty of fluids; avoid over heating.  Tanning Beds:  Because their possible side effects are still unknown, tanning beds are not recommended.  Ultrasound Scans:  Routine ultrasounds are performed  at approximately 20 weeks.  You will be able to see your baby's general anatomy an if you would like to know the gender this can usually be determined as well.  If it is questionable when you conceived you may also receive an ultrasound early in your pregnancy for dating purposes.  Otherwise ultrasound exams are not routinely performed unless there is a medical necessity.  Although you can request a scan we ask that you pay for it when   conducted because insurance does not cover " patient request" scans.  Work: If your pregnancy proceeds without complications you may work until your due date, unless your physician or employer advises otherwise.  Round Ligament Pain/Pelvic Discomfort:  Sharp, shooting pains not associated with bleeding are fairly common, usually occurring in the second trimester of pregnancy.  They tend to be worse when standing up or when you remain standing for long periods of time.  These are the result of pressure of certain pelvic ligaments called "round ligaments".  Rest, Tylenol and heat seem to be the most effective relief.  As the womb and fetus grow, they rise out of the pelvis and the discomfort improves.  Please notify the office if your pain seems different than that described.  It may represent a more serious condition.  Common Medications Safe in Pregnancy  Acne:      Constipation:  Benzoyl Peroxide     Colace  Clindamycin      Dulcolax Suppository  Topica Erythromycin     Fibercon  Salicylic Acid      Metamucil         Miralax AVOID:        Senakot   Accutane    Cough:  Retin-A       Cough Drops  Tetracycline      Phenergan w/ Codeine if Rx  Minocycline      Robitussin (Plain & DM)  Antibiotics:     Crabs/Lice:  Ceclor       RID  Cephalosporins    AVOID:  E-Mycins      Kwell  Keflex  Macrobid/Macrodantin   Diarrhea:  Penicillin      Kao-Pectate  Zithromax      Imodium AD         PUSH FLUIDS AVOID:       Cipro     Fever:  Tetracycline      Tylenol (Regular  or Extra  Minocycline       Strength)  Levaquin      Extra Strength-Do not          Exceed 8 tabs/24 hrs Caffeine:        <200mg/day (equiv. To 1 cup of coffee or  approx. 3 12 oz sodas)         Gas: Cold/Hayfever:       Gas-X  Benadryl      Mylicon  Claritin       Phazyme  **Claritin-D        Chlor-Trimeton    Headaches:  Dimetapp      ASA-Free Excedrin  Drixoral-Non-Drowsy     Cold Compress  Mucinex (Guaifenasin)     Tylenol (Regular or Extra  Sudafed/Sudafed-12 Hour     Strength)  **Sudafed PE Pseudoephedrine   Tylenol Cold & Sinus     Vicks Vapor Rub  Zyrtec  **AVOID if Problems With Blood Pressure         Heartburn: Avoid lying down for at least 1 hour after meals  Aciphex      Maalox     Rash:  Milk of Magnesia     Benadryl    Mylanta       1% Hydrocortisone Cream  Pepcid  Pepcid Complete   Sleep Aids:  Prevacid      Ambien   Prilosec       Benadryl  Rolaids       Chamomile Tea  Tums (Limit 4/day)     Unisom           Tylenol PM         Warm milk-add vanilla or  Hemorrhoids:       Sugar for taste  Anusol/Anusol H.C.  (RX: Analapram 2.5%)  Sugar Substitutes:  Hydrocortisone OTC     Ok in moderation  Preparation H      Tucks        Vaseline lotion applied to tissue with wiping    Herpes:     Throat:  Acyclovir      Oragel  Famvir  Valtrex     Vaccines:         Flu Shot Leg Cramps:       *Gardasil  Benadryl      Hepatitis A         Hepatitis B Nasal Spray:       Pneumovax  Saline Nasal Spray     Polio Booster         Tetanus Nausea:       Tuberculosis test or PPD  Vitamin B6 25 mg TID   AVOID:    Dramamine      *Gardasil  Emetrol       Live Poliovirus  Ginger Root 250 mg QID    MMR (measles, mumps &  High Complex Carbs @ Bedtime    rebella)  Sea Bands-Accupressure    Varicella (Chickenpox)  Unisom 1/2 tab TID     *No known complications           If received before Pain:         Known pregnancy;   Darvocet       Resume series  after  Lortab        Delivery  Percocet    Yeast:   Tramadol      Femstat  Tylenol 3      Gyne-lotrimin  Ultram       Monistat  Vicodin           MISC:         All Sunscreens           Hair Coloring/highlights          Insect Repellant's          (Including DEET)         Mystic Tans  

## 2023-02-10 NOTE — Progress Notes (Signed)
New OB Intake  I connected with  Kaitlyn Galloway on 02/10/23 at 11:15 AM EDT by telephone Video Visit and verified that I am speaking with the correct person using two identifiers. Nurse is located at [facility] and pt is located at home.  I explained I am completing New OB Intake today. We discussed her EDD of 10/05/2023 that is based on LMP of 12/29/2022. Pt is G2/P1001. I reviewed her allergies, medications, Medical/Surgical/OB history, and appropriate screenings. There are no cats in the home. Based on history, this is a/an pregnancy uncomplicated .   Patient Active Problem List   Diagnosis Date Noted   Supervision of other normal pregnancy, antepartum 02/10/2023   Symptomatic anemia 04/29/2021   Nausea/vomiting in pregnancy 10/13/2020   Asthma, well controlled 12/26/2013    Concerns addressed today None  Delivery Plans:  Plans to deliver at [facility]  Anatomy US Explained first scheduled [place] will be Sept 25th and an anatomy scan will be done at 20 weeks.  Labs Discussed genetic screening with patient. Patient desires genetic testing to be drawn at new OB visit. Discussed possible labs to be drawn at new OB appointment.  COVID Vaccine Patient has not had COVID vaccine.   Social Determinants of Health Food Insecurity: denies food insecurity.  Transportation: Patient denies transportation needs. Childcare: Discussed no children allowed at ultrasound appointments.   First visit review I reviewed new OB appt with pt. I explained she will have ob bloodwork and pap smear/pelvic exam if indicated. Explained pt will be seen by an AOB Provider at first visit; encounter routed to appropriate provider.   Loran Senters, [facility] 02/10/2023  11:46 AM

## 2023-02-15 ENCOUNTER — Other Ambulatory Visit: Payer: Self-pay | Admitting: Obstetrics and Gynecology

## 2023-02-15 ENCOUNTER — Ambulatory Visit: Payer: BC Managed Care – PPO

## 2023-02-15 DIAGNOSIS — Z3687 Encounter for antenatal screening for uncertain dates: Secondary | ICD-10-CM | POA: Diagnosis not present

## 2023-02-15 DIAGNOSIS — Z3A01 Less than 8 weeks gestation of pregnancy: Secondary | ICD-10-CM | POA: Diagnosis not present

## 2023-02-15 DIAGNOSIS — N912 Amenorrhea, unspecified: Secondary | ICD-10-CM

## 2023-03-22 ENCOUNTER — Ambulatory Visit (INDEPENDENT_AMBULATORY_CARE_PROVIDER_SITE_OTHER): Payer: BC Managed Care – PPO | Admitting: Certified Nurse Midwife

## 2023-03-22 ENCOUNTER — Encounter: Payer: Self-pay | Admitting: Certified Nurse Midwife

## 2023-03-22 ENCOUNTER — Other Ambulatory Visit (HOSPITAL_COMMUNITY)
Admission: RE | Admit: 2023-03-22 | Discharge: 2023-03-22 | Disposition: A | Discharge status: Home / Self Care | Payer: BC Managed Care – PPO | Source: Ambulatory Visit | Attending: Certified Nurse Midwife | Admitting: Certified Nurse Midwife

## 2023-03-22 VITALS — BP 122/70 | HR 79 | Wt 188.5 lb

## 2023-03-22 DIAGNOSIS — Z3481 Encounter for supervision of other normal pregnancy, first trimester: Secondary | ICD-10-CM

## 2023-03-22 DIAGNOSIS — Z130 Encounter for screening for diseases of the blood and blood-forming organs and certain disorders involving the immune mechanism: Secondary | ICD-10-CM

## 2023-03-22 DIAGNOSIS — Z1379 Encounter for other screening for genetic and chromosomal anomalies: Secondary | ICD-10-CM

## 2023-03-22 DIAGNOSIS — Z113 Encounter for screening for infections with a predominantly sexual mode of transmission: Secondary | ICD-10-CM | POA: Diagnosis present

## 2023-03-22 DIAGNOSIS — Z3A11 11 weeks gestation of pregnancy: Secondary | ICD-10-CM | POA: Diagnosis not present

## 2023-03-22 DIAGNOSIS — Z0283 Encounter for blood-alcohol and blood-drug test: Secondary | ICD-10-CM

## 2023-03-22 DIAGNOSIS — Z0184 Encounter for antibody response examination: Secondary | ICD-10-CM

## 2023-03-22 DIAGNOSIS — T7589XA Other specified effects of external causes, initial encounter: Secondary | ICD-10-CM

## 2023-03-22 MED ORDER — ASPIRIN 81 MG PO TBEC: 162.0000 mg | DELAYED_RELEASE_TABLET | Freq: Every day | ORAL | 12 refills | Status: DC

## 2023-03-22 NOTE — Progress Notes (Signed)
NEW OB HISTORY AND PHYSICAL  SUBJECTIVE:       Kaitlyn Galloway is a 22 y.o. G32P1001 female, Patient's last menstrual period was 12/29/2022., Estimated Date of Delivery: 10/05/23, [redacted]w[redacted]d, presents today for establishment of Prenatal Care. She has no unusual complaints   Relationship: married  Living : spouse, child and her mother Work : none Exercise : walking prior to pregnancy Alcohol/drugs/smoking/Vape:  denies use    Gynecologic History Patient's last menstrual period was 12/29/2022. Normal Contraception: none Last Pap: 10/13/2020. Results were: normal  Obstetric History OB History  Gravida Para Term Preterm AB Living  2 1 1     1   SAB IAB Ectopic Multiple Live Births        0 1    # Outcome Date GA Lbr Len/2nd Weight Sex Type Anes PTL Lv  2 Current           1 Term 05/29/21 [redacted]w[redacted]d / 00:11 7 lb 0.5 oz (3.19 kg) F Vag-Spont None  LIV    Past Medical History:  Diagnosis Date   Asthma    DR. PRINGLE   Heart murmur    INNOCENT   Leakage of amniotic fluid 05/04/2021    Past Surgical History:  Procedure Laterality Date   NO PAST SURGERIES      Current Outpatient Medications on File Prior to Visit  Medication Sig Dispense Refill   albuterol (PROVENTIL HFA;VENTOLIN HFA) 108 (90 Base) MCG/ACT inhaler Inhale into the lungs every 6 (six) hours as needed for wheezing or shortness of breath.     levonorgestrel-ethinyl estradiol (AVIANE) 0.1-20 MG-MCG tablet Take 1 tablet by mouth daily. (Patient not taking: Reported on 02/10/2023) 84 tablet 4   Prenatal Vit-Iron Carbonyl-FA (PRENATABS RX) 29-1 MG TABS Take 1 tablet by mouth daily. 60 tablet 4   No current facility-administered medications on file prior to visit.    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   Social Determinants of Health   Financial Resource Strain: Low Risk  (02/10/2023)   Overall Financial Resource Strain (CARDIA)    Difficulty of Paying Living Expenses: Not very hard  Food Insecurity: No Food Insecurity (02/10/2023)   Hunger Vital Sign    Worried About Running Out of Food in the Last Year: Never true    Ran Out of Food in the Last Year: Never true  Transportation Needs: No Transportation Needs (02/10/2023)   PRAPARE - Administrator, Civil Service (Medical): No    Lack of Transportation (Non-Medical): No  Physical Activity: Sufficiently Active (02/10/2023)   Exercise Vital Sign    Days of Exercise per Week: 3 days  Minutes of Exercise per Session: 60 min  Stress: No Stress Concern Present (02/10/2023)   [employer] of Occupational Health - Occupational Stress Questionnaire    Feeling of Stress : Not at all  Social Connections: Moderately Integrated (02/10/2023)   Social Connection and Isolation Panel [NHANES]    Frequency of Communication with Friends and Family: More than three times a week    Frequency of Social Gatherings with Friends and Family: Three times a week    Attends Religious Services: More than 4 times per year    Active Member of Clubs or Organizations: No    Attends Banker Meetings: Never    Marital Status: Married  Catering manager Violence: Not At Risk (02/10/2023)   Humiliation, Afraid,  Rape, and Kick questionnaire    Fear of Current or Ex-Partner: No    Emotionally Abused: No    Physically Abused: No    Sexually Abused: No    Family History  Problem Relation Age of Onset   Healthy Mother    Healthy Father    Healthy Sister    Healthy Brother    Hypertension Maternal Grandmother    Healthy Maternal Grandfather    Healthy Paternal Grandmother    Healthy Paternal Grandfather    Hypertension Maternal Aunt    Breast cancer Other 60   Cancer Other 90       COLON    The following portions of the patient's history were reviewed and updated as appropriate: allergies, current medications, past OB history, past medical history, past surgical history, past family history, past social history, and problem list.    OBJECTIVE: Initial Physical Exam (New OB)  GENERAL APPEARANCE: alert, well appearing, in no apparent distress, oriented to person, place and time HEAD: normocephalic, atraumatic MOUTH: mucous membranes moist, pharynx normal without lesions THYROID: no thyromegaly or masses present BREASTS: no masses noted, no significant tenderness, no palpable axillary nodes, no skin changes LUNGS: clear to auscultation, no wheezes, rales or rhonchi, symmetric air entry HEART: regular rate and rhythm, no murmurs ABDOMEN: soft, nontender, nondistended, no abnormal masses, no epigastric pain EXTREMITIES: no redness or tenderness in the calves or thighs SKIN: normal coloration and turgor, no rashes LYMPH NODES: no adenopathy palpable NEUROLOGIC: alert, oriented, normal speech, no focal findings or movement disorder noted  PELVIC EXAM not indicated pap not due, tested pelvis  ASSESSMENT: Normal pregnancy  PLAN: Prenatal care See ordersNew OB counseling: The patient has been given an overview regarding routine prenatal care. Recommendations regarding diet, weight gain, and exercise in pregnancy were given. Prenatal testing, optional genetic testing, carrier screening,  and ultrasound use in pregnancy were reviewed.  Benefits of Breast Feeding were discussed. The patient is encouraged to consider nursing her baby post partum.  Doreene Burke, CNM

## 2023-03-22 NOTE — Patient Instructions (Signed)
Prenatal Care Prenatal care is health care during pregnancy. It helps you and your unborn baby (fetus) stay as healthy as possible. Prenatal care may be provided by a midwife, a family practice doctor, a mid-level practitioner (nurse practitioner or physician assistant), or a childbirth and pregnancy doctor (obstetrician). How does this affect me? During pregnancy, you will be closely monitored for any new conditions that might develop. To lower your risk of pregnancy complications, you and your health care provider will talk about any underlying conditions you have. How does this affect my baby? Early and consistent prenatal care increases the chance that your baby will be healthy during pregnancy. Prenatal care lowers the risk that your baby will be: Born early (prematurely). Smaller than expected at birth (small for gestational age). What can I expect at the first prenatal care visit? Your first prenatal care visit will likely be the longest. You should schedule your first prenatal care visit as soon as you know that you are pregnant. Your first visit is a good time to talk about any questions or concerns you have about pregnancy. Medical history At your visit, you and your health care provider will talk about your medical history, including: Any past pregnancies. Your family's medical history. Medical history of the baby's father. Any long-term (chronic) health conditions you have and how you manage them. Any surgeries or procedures you have had. Any current over-the-counter or prescription medicines, herbs, or supplements that you are taking. Other factors that could pose a risk to your baby, including: Exposure to harmful chemicals or radiation at work or at home. Any substance use, including tobacco, alcohol, and drug use. Your home setting and your stress levels, including: Exposure to abuse or violence. Household financial strain. Your daily health habits, including diet and  exercise. Tests and screenings Your health care provider will: Measure your weight, height, and blood pressure. Do a physical exam, including a pelvic and breast exam. Perform blood tests and urine tests to check for: Urinary tract infection. Sexually transmitted infections (STIs). Low iron levels in your blood (anemia). Blood type and certain proteins on red blood cells (Rh antibodies). Infections and immunity to viruses, such as hepatitis B and rubella. HIV (human immunodeficiency virus). Discuss your options for genetic screening. Tips about staying healthy Your health care provider will also give you information about how to keep yourself and your baby healthy, including: Nutrition and taking vitamins. Physical activity. How to manage pregnancy symptoms such as nausea and vomiting (morning sickness). Infections and substances that may be harmful to your baby and how to avoid them. Food safety. Dental care. Working. Travel. Warning signs to watch for and when to call your health care provider. How often will I have prenatal care visits? After your first prenatal care visit, you will have regular visits throughout your pregnancy. The visit schedule is often as follows: Up to week 28 of pregnancy: once every 4 weeks. 28-36 weeks: once every 2 weeks. After 36 weeks: every week until delivery. Some women may have visits more or less often depending on any underlying health conditions and the health of the baby. Keep all follow-up and prenatal care visits. This is important. What happens during routine prenatal care visits? Your health care provider will: Measure your weight and blood pressure. Check for fetal heart sounds. Measure the height of your uterus in your abdomen (fundal height). This may be measured starting around week 20 of pregnancy. Check the position of your baby inside your uterus. Ask questions   about your diet, sleeping patterns, and whether you can feel the baby  move. Review warning signs to watch for and signs of labor. Ask about any pregnancy symptoms you are having and how you are dealing with them. Symptoms may include: Headaches. Nausea and vomiting. Vaginal discharge. Swelling. Fatigue. Constipation. Changes in your vision. Feeling persistently sad or anxious. Any discomfort, including back or pelvic pain. Bleeding or spotting. Make a list of questions to ask your health care provider at your routine visits. What tests might I have during prenatal care visits? You may have blood, urine, and imaging tests throughout your pregnancy, such as: Urine tests to check for glucose, protein, or signs of infection. Glucose tests to check for a form of diabetes that can develop during pregnancy (gestational diabetes mellitus). This is usually done around week 24 of pregnancy. Ultrasounds to check your baby's growth and development, to check for birth defects, and to check your baby's well-being. These can also help to decide when you should deliver your baby. A test to check for group B strep (GBS) infection. This is usually done around week 36 of pregnancy. Genetic testing. This may include blood, fluid, or tissue sampling, or imaging tests, such as an ultrasound. Some genetic tests are done during the first trimester and some are done during the second trimester. What else can I expect during prenatal care visits? Your health care provider may recommend getting certain vaccines during pregnancy. These may include: A yearly flu shot (annual influenza vaccine). This is especially important if you will be pregnant during flu season. Tdap (tetanus, diphtheria, pertussis) vaccine. Getting this vaccine during pregnancy can protect your baby from whooping cough (pertussis) after birth. This vaccine may be recommended between weeks 27 and 36 of pregnancy. A COVID-19 vaccine. Later in your pregnancy, your health care provider may give you information  about: Childbirth and breastfeeding classes. Choosing a health care provider for your baby. Umbilical cord banking. Breastfeeding. Birth control after your baby is born. The hospital labor and delivery unit and how to set up a tour. Registering at the hospital before you go into labor. Where to find more information Office on Women's Health: womenshealth.gov American Pregnancy Association: americanpregnancy.org March of Dimes: marchofdimes.org Summary Prenatal care helps you and your baby stay as healthy as possible during pregnancy. Your first prenatal care visit will most likely be the longest. You will have visits and tests throughout your pregnancy to monitor your health and your baby's health. Bring a list of questions to your visits to ask your health care provider. Make sure to keep all follow-up and prenatal care visits. This information is not intended to replace advice given to you by your health care provider. Make sure you discuss any questions you have with your health care provider. Document Revised: 02/20/2020 Document Reviewed: 02/20/2020 Elsevier Patient Education  2024 Elsevier Inc.  

## 2023-03-23 LAB — CBC/D/PLT+RPR+RH+ABO+RUBIGG...
Antibody Screen: NEGATIVE
Basophils Absolute: 0 10*3/uL (ref 0.0–0.2)
Basos: 1 %
EOS (ABSOLUTE): 0.1 10*3/uL (ref 0.0–0.4)
Eos: 4 %
HCV Ab: NONREACTIVE
HIV Screen 4th Generation wRfx: NONREACTIVE
Hematocrit: 39.6 % (ref 34.0–46.6)
Hemoglobin: 12.7 g/dL (ref 11.1–15.9)
Hepatitis B Surface Ag: NEGATIVE
Immature Grans (Abs): 0 10*3/uL (ref 0.0–0.1)
Immature Granulocytes: 0 %
Lymphocytes Absolute: 1.2 10*3/uL (ref 0.7–3.1)
Lymphs: 29 %
MCH: 29.9 pg (ref 26.6–33.0)
MCHC: 32.1 g/dL (ref 31.5–35.7)
MCV: 93 fL (ref 79–97)
Monocytes Absolute: 0.2 10*3/uL (ref 0.1–0.9)
Monocytes: 6 %
Neutrophils Absolute: 2.5 10*3/uL (ref 1.4–7.0)
Neutrophils: 60 %
Platelets: 185 10*3/uL (ref 150–450)
RBC: 4.25 x10E6/uL (ref 3.77–5.28)
RDW: 13.9 % (ref 11.7–15.4)
RPR Ser Ql: NONREACTIVE
Rh Factor: POSITIVE
Rubella Antibodies, IGG: 4.66 {index} (ref >0.99)
Varicella zoster IgG: REACTIVE
WBC: 4 10*3/uL (ref 3.4–10.8)

## 2023-03-23 LAB — URINALYSIS, ROUTINE W REFLEX MICROSCOPIC
Bilirubin, UA: NEGATIVE
Glucose, UA: NEGATIVE
Leukocytes,UA: NEGATIVE
Nitrite, UA: NEGATIVE
RBC, UA: NEGATIVE
Specific Gravity, UA: 1.029 (ref 1.005–1.030)
Urobilinogen, Ur: 0.2 mg/dL (ref 0.2–1.0)
pH, UA: 6 (ref 5.0–7.5)

## 2023-03-23 LAB — HCV INTERPRETATION

## 2023-03-24 LAB — NICOTINE SCREEN, URINE: Cotinine Ql Scrn, Ur: NEGATIVE ng/mL

## 2023-03-24 LAB — CERVICOVAGINAL ANCILLARY ONLY
Chlamydia: NEGATIVE
Comment: NEGATIVE
Comment: NORMAL
Neisseria Gonorrhea: NEGATIVE

## 2023-03-24 LAB — MONITOR DRUG PROFILE 14(MW)
Amphetamine Scrn, Ur: NEGATIVE ng/mL
BARBITURATE SCREEN URINE: NEGATIVE ng/mL
BENZODIAZEPINE SCREEN, URINE: NEGATIVE ng/mL
Buprenorphine, Urine: NEGATIVE ng/mL
CANNABINOIDS UR QL SCN: NEGATIVE ng/mL
Cocaine (Metab) Scrn, Ur: NEGATIVE ng/mL
Creatinine(Crt), U: 276.3 mg/dL (ref 20.0–300.0)
Fentanyl, Urine: NEGATIVE pg/mL
Meperidine Screen, Urine: NEGATIVE ng/mL
Methadone Screen, Urine: NEGATIVE ng/mL
OXYCODONE+OXYMORPHONE UR QL SCN: NEGATIVE ng/mL
Opiate Scrn, Ur: NEGATIVE ng/mL
Ph of Urine: 5.4 (ref 4.5–8.9)
Phencyclidine Qn, Ur: NEGATIVE ng/mL
Propoxyphene Scrn, Ur: NEGATIVE ng/mL
SPECIFIC GRAVITY: 1.022
Tramadol Screen, Urine: NEGATIVE ng/mL

## 2023-03-24 LAB — URINE CULTURE, OB REFLEX

## 2023-03-24 LAB — CULTURE, OB URINE

## 2023-03-26 LAB — MATERNIT 21 PLUS CORE, BLOOD
Fetal Fraction: 22
Result (T21): NEGATIVE
Trisomy 13 (Patau syndrome): NEGATIVE
Trisomy 18 (Edwards syndrome): NEGATIVE
Trisomy 21 (Down syndrome): NEGATIVE

## 2023-04-19 ENCOUNTER — Encounter: Payer: Self-pay | Admitting: Obstetrics

## 2023-04-19 ENCOUNTER — Ambulatory Visit (INDEPENDENT_AMBULATORY_CARE_PROVIDER_SITE_OTHER): Payer: BC Managed Care – PPO | Admitting: Obstetrics

## 2023-04-19 VITALS — BP 120/80 | HR 74 | Wt 192.0 lb

## 2023-04-19 DIAGNOSIS — Z3A15 15 weeks gestation of pregnancy: Secondary | ICD-10-CM

## 2023-04-19 DIAGNOSIS — O99212 Obesity complicating pregnancy, second trimester: Secondary | ICD-10-CM

## 2023-04-19 DIAGNOSIS — O99210 Obesity complicating pregnancy, unspecified trimester: Secondary | ICD-10-CM

## 2023-04-19 DIAGNOSIS — E669 Obesity, unspecified: Secondary | ICD-10-CM

## 2023-04-19 DIAGNOSIS — Z3480 Encounter for supervision of other normal pregnancy, unspecified trimester: Secondary | ICD-10-CM

## 2023-04-19 NOTE — Progress Notes (Deleted)
    Return Prenatal Note   Subjective  22 y.o. G2P1001 at [redacted]w[redacted]d presents for this follow-up prenatal visit.  Patient ***  Patient reports:   Denies vaginal bleeding or leaking fluid. Objective  Flow sheet Vitals:   Total weight gain: 3 lb 8 oz (1.588 kg)  General Appearance  No acute distress, well appearing, and well nourished Pulmonary   Normal work of breathing Neurologic   Alert and oriented to person, place, and time Psychiatric   Mood and affect within normal limits  Assessment/Plan   Plan  22 y.o. G2P1001 at [redacted]w[redacted]d presents for follow-up OB visit. Reviewed prenatal record including previous visit note. There are no diagnoses linked to this encounter.  No problem-specific Assessment & Plan notes found for this encounter.    No orders of the defined types were placed in this encounter.  No follow-ups on file.   No future appointments.  For next visit:  {[email or web address]:29716}      Julieanne Manson, DO [facility] [place]

## 2023-04-19 NOTE — Progress Notes (Signed)
    Return Prenatal Note   Subjective  22 y.o. G2P1001 at [redacted]w[redacted]d presents for this follow-up prenatal visit.   Patient is well today, will be traveling to [facility] for holiday. No concerns. Is feeling fetal movement already.   Patient reports: Movement: Present Contractions: Not present Denies vaginal bleeding or leaking fluid. Objective  Flow sheet Vitals: Pulse Rate: 74 BP: 120/80 Fetal Heart Rate (bpm): 157 Total weight gain: 7 lb (3.175 kg)  General Appearance  No acute distress, well appearing, and well nourished Pulmonary   Normal work of breathing Neurologic   Alert and oriented to person, place, and time Psychiatric   Mood and affect within normal limits  Assessment/Plan   Plan  22 y.o. G2P1001 at [redacted]w[redacted]d by LMP=[redacted]w[redacted]d [place] presents for follow-up OB visit. Reviewed prenatal record including previous visit note. 1. Supervision of other normal pregnancy, antepartum -Anatomy US ordered -Reviewed labs and NIPT results  2. Obesity affecting pregnancy, antepartum, unspecified obesity type - Early 1hGTT ordered, complete within the next week.   Orders Placed This Encounter  Procedures   US OB Comp + 14 Wk    Standing Status:   Future    Standing Expiration Date:   04/18/2024    Order Specific Question:   Reason for Exam (SYMPTOM  OR DIAGNOSIS REQUIRED)    Answer:   anatomy    Order Specific Question:   Preferred Imaging Location?    Answer:   Internal   Glucose, 1 hour   Return in about 4 weeks (around 05/17/2023) for ROB w/anatomy US. Complete early glucola within the next week.   For next visit:  continue with routine prenatal care    Julieanne Manson, DO [facility] [place] [facility]

## 2023-04-26 ENCOUNTER — Other Ambulatory Visit: Payer: BC Managed Care – PPO

## 2023-05-02 ENCOUNTER — Other Ambulatory Visit: Payer: BC Managed Care – PPO

## 2023-05-03 LAB — GLUCOSE, 1 HOUR GESTATIONAL: Gestational Diabetes Screen: 98 mg/dL (ref 70–139)

## 2023-05-19 ENCOUNTER — Other Ambulatory Visit: Payer: BC Managed Care – PPO

## 2023-05-19 ENCOUNTER — Ambulatory Visit (INDEPENDENT_AMBULATORY_CARE_PROVIDER_SITE_OTHER): Payer: BC Managed Care – PPO | Admitting: Certified Nurse Midwife

## 2023-05-19 VITALS — BP 112/73 | HR 75 | Wt 195.3 lb

## 2023-05-19 DIAGNOSIS — Z3A20 20 weeks gestation of pregnancy: Secondary | ICD-10-CM

## 2023-05-19 DIAGNOSIS — Z3480 Encounter for supervision of other normal pregnancy, unspecified trimester: Secondary | ICD-10-CM

## 2023-05-19 LAB — POCT URINALYSIS DIPSTICK
Bilirubin, UA: NEGATIVE
Blood, UA: NEGATIVE
Glucose, UA: NEGATIVE
Ketones, UA: NEGATIVE
Leukocytes, UA: NEGATIVE
Nitrite, UA: NEGATIVE
Protein, UA: NEGATIVE
Spec Grav, UA: 1.01 (ref 1.010–1.025)
Urobilinogen, UA: 0.2 U/dL
pH, UA: 6.5 (ref 5.0–8.0)

## 2023-05-19 NOTE — Progress Notes (Signed)
    Return Prenatal Note   Subjective   22 y.o. G2P1001 at [redacted]w[redacted]d presents for this follow-up prenatal visit.  Patient feeling well, active baby, no concerns voices. Declines flu vaccine. Patient reports: Movement: Present Contractions: Not present  Objective   Flow sheet Vitals: Pulse Rate: 75 BP: 112/73 Fundal Height:  (@U ) Fetal Heart Rate (bpm): 145 Total weight gain: 10 lb 4.8 oz (4.672 kg)  General Appearance  No acute distress, well appearing, and well nourished Pulmonary   Normal work of breathing Neurologic   Alert and oriented to person, place, and time Psychiatric   Mood and affect within normal limits  Assessment/Plan   Plan  22 y.o. G2P1001 at [redacted]w[redacted]d presents for follow-up OB visit. Reviewed prenatal record including previous visit note.  Supervision of other normal pregnancy, antepartum Red flag warning signs reviewed. Anatomy ultrasound scheduled 05/30/23. Anticipatory guidance for upcoming prenatal care reviewed. Flu vaccine declined, reviewed recommendation for TDAP in 3rd trimester.      No orders of the defined types were placed in this encounter.  Return in 4 weeks (on 06/16/2023) for ROB.   Future Appointments  Date Time Provider Department Center  05/30/2023  8:15 AM [facility] [place] 1 AOB-[facility] None  06/16/2023 10:55 AM Tresea Mall, CNM [facility] None    For next visit:  continue with routine prenatal care     Dominica Severin, CNM  12/27/243:00 PM

## 2023-05-19 NOTE — Assessment & Plan Note (Signed)
Red flag warning signs reviewed. Anatomy ultrasound scheduled 05/30/23. Anticipatory guidance for upcoming prenatal care reviewed. Flu vaccine declined, reviewed recommendation for TDAP in 3rd trimester.

## 2023-05-19 NOTE — Patient Instructions (Signed)

## 2023-05-19 NOTE — Addendum Note (Signed)
Addended by: Burtis Junes on: 05/19/2023 03:20 PM   Modules accepted: Orders

## 2023-05-24 NOTE — L&D Delivery Note (Signed)
         Delivery Note   Kaitlyn Galloway is a 23 y.o. [id number] at [redacted]w[redacted]d Estimated Date of Delivery: 10/05/23  PRE-OPERATIVE DIAGNOSIS:  1) [redacted]w[redacted]d pregnancy.   POST-OPERATIVE DIAGNOSIS:  1) [redacted]w[redacted]d pregnancy s/p Vaginal, Spontaneous  Delivery Type: Vaginal, Spontaneous   Delivery Anesthesia: None  Labor Complications:  none    ESTIMATED BLOOD LOSS: 300 ml    FINDINGS:   1) female infant, Apgar scores of 9   at 1 minute and 9   at 5 minutes and a birthweight is pending, infant skin to skin.    2) Nuchal cord: no  SPECIMENS:   PLACENTA:   Appearance: Intact, 3 vessel cord   Removal: Spontaneous     Disposition:  per protocol   DISPOSITION:  Infant to left in stable condition in the delivery room, with L&D personnel and mother,  NARRATIVE SUMMARY: Labor course:  [name] is a [id number] at [redacted]w[redacted]d who presented for labor management.  She progressed well in labor without pitocin .  She did not receive anesthesia and proceeded to complete dilation. She evidenced good maternal expulsive effort during the second stage. She went on to deliver a viable female infant. The placenta delivered without problems and was noted to be complete. A perineal and vaginal examination was performed. Episiotomy/Lacerations:  none.  The patient tolerated this well.  [name], CNM  10/12/2023 6:46 AM

## 2023-05-29 ENCOUNTER — Other Ambulatory Visit: Payer: Self-pay | Admitting: Obstetrics

## 2023-05-29 DIAGNOSIS — Z363 Encounter for antenatal screening for malformations: Secondary | ICD-10-CM

## 2023-05-29 DIAGNOSIS — O99210 Obesity complicating pregnancy, unspecified trimester: Secondary | ICD-10-CM

## 2023-05-29 DIAGNOSIS — Z3480 Encounter for supervision of other normal pregnancy, unspecified trimester: Secondary | ICD-10-CM

## 2023-05-30 ENCOUNTER — Ambulatory Visit (INDEPENDENT_AMBULATORY_CARE_PROVIDER_SITE_OTHER): Payer: Self-pay

## 2023-05-30 DIAGNOSIS — Z363 Encounter for antenatal screening for malformations: Secondary | ICD-10-CM

## 2023-05-30 DIAGNOSIS — Z3A21 21 weeks gestation of pregnancy: Secondary | ICD-10-CM

## 2023-06-06 ENCOUNTER — Encounter: Payer: Self-pay | Admitting: Internal Medicine

## 2023-06-16 ENCOUNTER — Encounter: Payer: Self-pay | Admitting: Internal Medicine

## 2023-06-16 ENCOUNTER — Encounter: Payer: Self-pay | Admitting: Advanced Practice Midwife

## 2023-06-16 ENCOUNTER — Ambulatory Visit (INDEPENDENT_AMBULATORY_CARE_PROVIDER_SITE_OTHER): Payer: No Typology Code available for payment source | Admitting: Advanced Practice Midwife

## 2023-06-16 VITALS — BP 102/65 | HR 106 | Wt 199.0 lb

## 2023-06-16 DIAGNOSIS — Z369 Encounter for antenatal screening, unspecified: Secondary | ICD-10-CM

## 2023-06-16 DIAGNOSIS — Z3482 Encounter for supervision of other normal pregnancy, second trimester: Secondary | ICD-10-CM

## 2023-06-16 DIAGNOSIS — Z131 Encounter for screening for diabetes mellitus: Secondary | ICD-10-CM

## 2023-06-16 DIAGNOSIS — Z3A24 24 weeks gestation of pregnancy: Secondary | ICD-10-CM

## 2023-06-16 DIAGNOSIS — Z130 Encounter for screening for diseases of the blood and blood-forming organs and certain disorders involving the immune mechanism: Secondary | ICD-10-CM

## 2023-06-16 DIAGNOSIS — Z113 Encounter for screening for infections with a predominantly sexual mode of transmission: Secondary | ICD-10-CM

## 2023-06-16 NOTE — Progress Notes (Signed)
Routine Prenatal Care Visit  Subjective  Kaitlyn Galloway is a 23 y.o. G2P1001 at [redacted]w[redacted]d being seen today for ongoing prenatal care.  She is currently monitored for the following issues for this low-risk pregnancy and has Asthma, well controlled; Nausea/vomiting in pregnancy; Symptomatic anemia; Supervision of other normal pregnancy, antepartum; and Obesity affecting pregnancy on their problem list.  ----------------------------------------------------------------------------------- Patient reports she is doing well. Accompanied by husband and daughter today. Good fetal movement. Reviewed 28w labs at next visit.   Contractions: Not present. Vag. Bleeding: None.  Movement: Present. Leaking Fluid denies.  ----------------------------------------------------------------------------------- The following portions of the patient's history were reviewed and updated as appropriate: allergies, current medications, past family history, past medical history, past social history, past surgical history and problem list. Problem list updated.  Objective  Blood pressure 102/65, pulse (!) 106, weight 199 lb (90.3 kg), last menstrual period 12/29/2022, currently breastfeeding. Pregravid weight 185 lb (83.9 kg) Total Weight Gain 14 lb (6.35 kg) Urinalysis: Urine Protein    Urine Glucose    Fetal Status: Fetal Heart Rate (bpm): 148 Fundal Height: 25 cm Movement: Present     General:  Alert, oriented and cooperative. Patient is in no acute distress.  Skin: Skin is warm and dry. No rash noted.   Cardiovascular: Normal heart rate noted  Respiratory: Normal respiratory effort, no problems with respiration noted  Abdomen: Soft, gravid, appropriate for gestational age. Pain/Pressure: Absent     Pelvic:  Cervical exam deferred        Extremities: Normal range of motion.  Edema: None  Mental Status: Normal mood and affect. Normal behavior. Normal judgment and thought content.   Assessment   23 y.o. G2P1001 at [redacted]w[redacted]d  by  10/05/2023, by Last Menstrual Period presenting for routine prenatal visit  Plan   second Problems (from 02/10/23 to present)     Problem Noted Diagnosed Resolved   Obesity affecting pregnancy 04/19/2023 by Julieanne Manson, MD  No   Supervision of other normal pregnancy, antepartum 02/10/2023 by Loran Senters, CMA  No   Overview Addendum 04/19/2023  3:34 PM by Julieanne Manson, MD   Clinical Staff Provider  Office Location  [facility] Dating  10/05/23 by LMP=[redacted]w[redacted]d [place]  Language  English Anatomy US    Flu Vaccine  declined Genetic Screen  NIPS:   TDaP vaccine   offer Hgb A1C or  GTT Early : Third trimester :   Covid declined   LAB RESULTS   Rhogam     Blood Type   A pos 10/13/2020  RSV  Antibody  Negative 10/13/2020  Feeding Plan Breast  Rubella  Immune 10/13/2020  Contraception undecided RPR   Non reactive 10/13/2020  Circumcision yes HBsAg   Negative 10/13/2020  Pediatrician  [facility] ELon HIV  Non reactive 10/13/2020  Support Person [place] Varicella  [place] 10/13/2020  Prenatal Classes no GBS  (For PCN allergy, check sensitivities)     Hep C   Negative 10/13/2020  BTL Consent  Pap Diagnosis  Date Value Ref Range Status  10/13/2020   Final   - Negative for intraepithelial lesion or malignancy (NILM)    VBAC Consent  Hgb Electro  Normal 10/13/2020    CF      SMA                    Preterm labor symptoms and general obstetric precautions including but not limited to vaginal bleeding, contractions, leaking of fluid and fetal movement were reviewed in detail with  the patient. Please refer to After Visit Summary for other counseling recommendations.   Return in about 4 weeks (around 07/14/2023) for rob/28w labs.  Tresea Mall, CNM 06/16/2023 11:20 AM

## 2023-06-26 ENCOUNTER — Other Ambulatory Visit: Payer: Self-pay | Admitting: Obstetrics

## 2023-06-26 DIAGNOSIS — O99212 Obesity complicating pregnancy, second trimester: Secondary | ICD-10-CM

## 2023-06-26 DIAGNOSIS — Z362 Encounter for other antenatal screening follow-up: Secondary | ICD-10-CM

## 2023-06-27 ENCOUNTER — Ambulatory Visit (INDEPENDENT_AMBULATORY_CARE_PROVIDER_SITE_OTHER): Payer: No Typology Code available for payment source

## 2023-06-27 DIAGNOSIS — E669 Obesity, unspecified: Secondary | ICD-10-CM | POA: Diagnosis not present

## 2023-06-27 DIAGNOSIS — O99212 Obesity complicating pregnancy, second trimester: Secondary | ICD-10-CM | POA: Diagnosis not present

## 2023-06-27 DIAGNOSIS — Z3A26 26 weeks gestation of pregnancy: Secondary | ICD-10-CM

## 2023-06-27 DIAGNOSIS — Z362 Encounter for other antenatal screening follow-up: Secondary | ICD-10-CM

## 2023-07-03 ENCOUNTER — Emergency Department: Payer: No Typology Code available for payment source

## 2023-07-03 ENCOUNTER — Emergency Department
Admission: EM | Admit: 2023-07-03 | Discharge: 2023-07-03 | Disposition: A | Discharge status: Home / Self Care | Payer: No Typology Code available for payment source | Attending: Emergency Medicine | Admitting: Emergency Medicine

## 2023-07-03 ENCOUNTER — Other Ambulatory Visit: Payer: Self-pay

## 2023-07-03 DIAGNOSIS — R002 Palpitations: Secondary | ICD-10-CM

## 2023-07-03 DIAGNOSIS — R000 Tachycardia, unspecified: Secondary | ICD-10-CM | POA: Diagnosis not present

## 2023-07-03 DIAGNOSIS — O99512 Diseases of the respiratory system complicating pregnancy, second trimester: Secondary | ICD-10-CM | POA: Diagnosis not present

## 2023-07-03 DIAGNOSIS — J45909 Unspecified asthma, uncomplicated: Secondary | ICD-10-CM | POA: Insufficient documentation

## 2023-07-03 DIAGNOSIS — O99412 Diseases of the circulatory system complicating pregnancy, second trimester: Secondary | ICD-10-CM | POA: Insufficient documentation

## 2023-07-03 LAB — COMPREHENSIVE METABOLIC PANEL
ALT: 11 U/L (ref 0–44)
AST: 14 U/L — ABNORMAL LOW (ref 15–41)
Albumin: 3.1 g/dL — ABNORMAL LOW (ref 3.5–5.0)
Alkaline Phosphatase: 60 U/L (ref 38–126)
Anion gap: 8 (ref 5–15)
BUN: <5 mg/dL — ABNORMAL LOW (ref 6–20)
CO2: 22 mmol/L (ref 22–32)
Calcium: 8.9 mg/dL (ref 8.9–10.3)
Chloride: 106 mmol/L (ref 98–111)
Creatinine, Ser: 0.5 mg/dL (ref 0.44–1.00)
GFR, Estimated: >60 mL/min (ref >60)
Glucose, Bld: 84 mg/dL (ref 70–99)
Potassium: 3.8 mmol/L (ref 3.5–5.1)
Sodium: 136 mmol/L (ref 135–145)
Total Bilirubin: 0.9 mg/dL (ref 0.0–1.2)
Total Protein: 6.7 g/dL (ref 6.5–8.1)

## 2023-07-03 LAB — CBC WITH DIFFERENTIAL/PLATELET
Abs Immature Granulocytes: 0.03 10*3/uL (ref 0.00–0.07)
Basophils Absolute: 0 10*3/uL (ref 0.0–0.1)
Basophils Relative: 1 %
Eosinophils Absolute: 0.1 10*3/uL (ref 0.0–0.5)
Eosinophils Relative: 1 %
HCT: 32 % — ABNORMAL LOW (ref 36.0–46.0)
Hemoglobin: 10.5 g/dL — ABNORMAL LOW (ref 12.0–15.0)
Immature Granulocytes: 0 %
Lymphocytes Relative: 10 %
Lymphs Abs: 0.9 10*3/uL (ref 0.7–4.0)
MCH: 28.8 pg (ref 26.0–34.0)
MCHC: 32.8 g/dL (ref 30.0–36.0)
MCV: 87.7 fL (ref 80.0–100.0)
Monocytes Absolute: 0.5 10*3/uL (ref 0.1–1.0)
Monocytes Relative: 6 %
Neutro Abs: 7.2 10*3/uL (ref 1.7–7.7)
Neutrophils Relative %: 82 %
Platelets: 196 10*3/uL (ref 150–400)
RBC: 3.65 MIL/uL — ABNORMAL LOW (ref 3.87–5.11)
RDW: 12.9 % (ref 11.5–15.5)
WBC: 8.8 10*3/uL (ref 4.0–10.5)
nRBC: 0 % (ref 0.0–0.2)

## 2023-07-03 LAB — TROPONIN I (HIGH SENSITIVITY): Troponin I (High Sensitivity): 5 ng/L (ref <18)

## 2023-07-03 MED ORDER — IOHEXOL 350 MG/ML SOLN
75.0000 mL | Freq: Once | INTRAVENOUS | Status: AC | PRN
  Administered 2023-07-03: 75 mL via INTRAVENOUS

## 2023-07-03 NOTE — ED Triage Notes (Signed)
 Pt reports feeling like she was having palpitations since last night. Pt reports it will improve and then worsen. Pt endorses HA since this started. Pt is [redacted] weeks gestation. G2 P1 A0 Pt denies any pregnancy complications.

## 2023-07-03 NOTE — ED Provider Notes (Signed)
 [facility] Provider Note    Event Date/Time   First MD Initiated Contact with Patient 07/03/23 1526     (approximate)  History   Chief Complaint: Palpitations (26wks)  HPI  [place] QUINNLY CARLS is a 23 y.o. female with a past medical history of asthma, G2 P1 approximately [redacted] weeks pregnant who presents to the emergency department for tachycardia.  According to the patient since yesterday she has felt her heart racing in her chest.  Denies any shortness of breath denies any chest pain.  Denies any leg pain or swelling.  Patient denies any abdominal pain vaginal bleeding discharge or fluid leakage.  No urinary symptoms no cough congestion fever or infectious symptoms.  On evaluation patient continues to be tachycardic around 110 to 120 bpm.  Physical Exam   Triage Vital Signs: ED Triage Vitals  Encounter Vitals Group     BP 07/03/23 1332 121/72     Systolic BP Percentile --      Diastolic BP Percentile --      Pulse Rate 07/03/23 1332 (!) 123     Resp 07/03/23 1332 16     Temp 07/03/23 1332 98.8 F (37.1 C)     Temp Source 07/03/23 1332 Oral     SpO2 07/03/23 1332 100 %     Weight --      Height --      Head Circumference --      Peak Flow --      Pain Score 07/03/23 1333 5     Pain Loc --      Pain Education --      Exclude from Growth Chart --     Most recent vital signs: Vitals:   07/03/23 1332  BP: 121/72  Pulse: (!) 123  Resp: 16  Temp: 98.8 F (37.1 C)  SpO2: 100%    General: Awake, no distress.  CV:  Good peripheral perfusion.  Regular rate and rhythm around 100 bpm. Resp:  Normal effort.  Equal breath sounds bilaterally.  Abd:  No distention.  Soft, nontender.  No rebound or guarding.  ED Results / Procedures / Treatments   EKG  EKG viewed and interpreted by myself shows sinus tachycardia 114 bpm with a narrow QRS, normal axis, normal intervals, nonspecific ST changes.  No ST elevation.  RADIOLOGY  I have reviewed interpret  the CT images.  No clot seen on my evaluation.   MEDICATIONS ORDERED IN ED: Medications  iohexol  (OMNIPAQUE ) 350 MG/ML injection 75 mL (75 mLs Intravenous Contrast Given 07/03/23 1613)     IMPRESSION / MDM / ASSESSMENT AND PLAN / ED COURSE  I reviewed the triage vital signs and the nursing notes.  Patient's presentation is most consistent with acute presentation with potential threat to life or bodily function.  Patient presents the emergency department for tachycardia.  Overall the patient appears well she continues to be tachycardic around 110 or so beats per minute.  EKG shows no significant findings does show some mild inferolateral T wave inversions which could be more due to demand.  However given the patient's current pregnant status with tachycardia of unknown reason we will proceed with a CTA of the chest to rule out pulmonary embolism.  Patient's basic labs including CBC and chemistry are reassuring.  I have added on a troponin as a precaution.  Patient agreeable to plan of care.  Discussed pros and cons as well as abdominal shielding.  Patient does deny any shortness of  breath any chest pain any pleuritic pain.  CT scan read as negative by radiology.  Given the patient's reassuring workup I believe the patient safe for discharge home.  Bedside ultrasound performed by myself shows good fetal heart tones at 154 bpm on Doppler mode.  Good fetal movement.  No concerning findings.  Patient will follow-up with her OB/GYN.  FINAL CLINICAL IMPRESSION(S) / ED DIAGNOSES   Tachycardia    Note:  This document was prepared using Dragon voice recognition software and may include unintentional dictation errors.   Ruth Cove, MD 07/03/23 [phone]

## 2023-07-03 NOTE — ED Triage Notes (Addendum)
 First nurse note: Pt is G2P1. Pt reports palpitations and HA since last pm. No hx HTN. Denies cough, N/V. L&D nurse reporting pt needing to be seen in ED prior to transfer to floor

## 2023-07-14 ENCOUNTER — Ambulatory Visit (INDEPENDENT_AMBULATORY_CARE_PROVIDER_SITE_OTHER): Payer: No Typology Code available for payment source | Admitting: Obstetrics

## 2023-07-14 ENCOUNTER — Other Ambulatory Visit: Payer: No Typology Code available for payment source

## 2023-07-14 VITALS — BP 98/56 | HR 94 | Wt 198.0 lb

## 2023-07-14 DIAGNOSIS — E669 Obesity, unspecified: Secondary | ICD-10-CM | POA: Diagnosis not present

## 2023-07-14 DIAGNOSIS — Z131 Encounter for screening for diabetes mellitus: Secondary | ICD-10-CM

## 2023-07-14 DIAGNOSIS — O99213 Obesity complicating pregnancy, third trimester: Secondary | ICD-10-CM

## 2023-07-14 DIAGNOSIS — R000 Tachycardia, unspecified: Secondary | ICD-10-CM

## 2023-07-14 DIAGNOSIS — O99891 Other specified diseases and conditions complicating pregnancy: Secondary | ICD-10-CM | POA: Diagnosis not present

## 2023-07-14 DIAGNOSIS — Z130 Encounter for screening for diseases of the blood and blood-forming organs and certain disorders involving the immune mechanism: Secondary | ICD-10-CM

## 2023-07-14 DIAGNOSIS — Z113 Encounter for screening for infections with a predominantly sexual mode of transmission: Secondary | ICD-10-CM

## 2023-07-14 DIAGNOSIS — Z3480 Encounter for supervision of other normal pregnancy, unspecified trimester: Secondary | ICD-10-CM

## 2023-07-14 DIAGNOSIS — Z3A28 28 weeks gestation of pregnancy: Secondary | ICD-10-CM

## 2023-07-14 DIAGNOSIS — Z369 Encounter for antenatal screening, unspecified: Secondary | ICD-10-CM

## 2023-07-14 DIAGNOSIS — Z3482 Encounter for supervision of other normal pregnancy, second trimester: Secondary | ICD-10-CM

## 2023-07-14 NOTE — Progress Notes (Signed)
    Return Prenatal Note   Subjective  23 y.o. G2P1001 at [redacted]w[redacted]d presents for this follow-up prenatal visit. Pregnancy notable for BMI, anemia, and maternal asthma.   Patient here with her mother and daughter. She was seen in [facility] ED on 07/03/23 for tachycardia. EKG showed sinus tach, CTA ruled out PE, and CBC/CMP reassuring. FHR in 150s and pt was in 110s-120s. She denies caffeine intake and no prior hx of this. No shortness of breath, dizziness or palpitations.   She declines Tdap vaccine today and has no questions about the BTC.   Patient reports: Movement: Present Contractions: Irritability Denies vaginal bleeding or leaking fluid. Objective  Flow sheet Vitals: Pulse Rate: 94 BP: (!) 98/56 Fundal Height: 28 cm Fetal Heart Rate (bpm): 135 Total weight gain: 13 lb (5.897 kg)  General Appearance  No acute distress, well appearing, and well nourished Pulmonary   Normal work of breathing Neurologic   Alert and oriented to person, place, and time Psychiatric   Mood and affect within normal limits  Assessment/Plan   Plan  23 y.o. G2P1001 at [redacted]w[redacted]d by LMP=6wk [place] presents for follow-up OB visit. Reviewed prenatal record including previous visit note. 1. Supervision of other normal pregnancy, antepartum (Primary) -Tdap declined; 1hGTT, BTC and 28wk labs completed  2. Obesity affecting pregnancy in third trimester  3. Tachycardia - Reassuring workup in ED on 07/03/23; will check TSH today - Avoid caffeine intake; if persists and/or becomes symptomatic, should see cardiology   Orders Placed This Encounter  Procedures   TSH Rfx on Abnormal to Free T4   Return in about 2 weeks (around 07/28/2023) for ROB.   Future Appointments  Date Time Provider Department Center  07/28/2023 10:15 AM Glenetta Borg, CNM [facility]OB-A[facility] None    For next visit:  continue with routine prenatal care   Julieanne Manson, DO [facility]

## 2023-07-15 LAB — 28 WEEK RH+PANEL
Basophils Absolute: 0 10*3/uL (ref 0.0–0.2)
Basos: 1 %
EOS (ABSOLUTE): 0.1 10*3/uL (ref 0.0–0.4)
Eos: 1 %
Gestational Diabetes Screen: 100 mg/dL (ref 70–139)
HIV Screen 4th Generation wRfx: NONREACTIVE
Hematocrit: 31.3 % — ABNORMAL LOW (ref 34.0–46.6)
Hemoglobin: 10.2 g/dL — ABNORMAL LOW (ref 11.1–15.9)
Immature Grans (Abs): 0 10*3/uL (ref 0.0–0.1)
Immature Granulocytes: 1 %
Lymphocytes Absolute: 1 10*3/uL (ref 0.7–3.1)
Lymphs: 21 %
MCH: 28.6 pg (ref 26.6–33.0)
MCHC: 32.6 g/dL (ref 31.5–35.7)
MCV: 88 fL (ref 79–97)
Monocytes Absolute: 0.3 10*3/uL (ref 0.1–0.9)
Monocytes: 5 %
Neutrophils Absolute: 3.4 10*3/uL (ref 1.4–7.0)
Neutrophils: 71 %
Platelets: 182 10*3/uL (ref 150–450)
RBC: 3.57 x10E6/uL — ABNORMAL LOW (ref 3.77–5.28)
RDW: 12.8 % (ref 11.7–15.4)
RPR Ser Ql: NONREACTIVE
WBC: 4.8 10*3/uL (ref 3.4–10.8)

## 2023-07-15 LAB — TSH RFX ON ABNORMAL TO FREE T4: TSH: 0.556 u[IU]/mL (ref 0.450–4.500)

## 2023-07-16 ENCOUNTER — Encounter: Payer: Self-pay | Admitting: Advanced Practice Midwife

## 2023-07-17 ENCOUNTER — Encounter: Payer: Self-pay | Admitting: Obstetrics

## 2023-07-28 ENCOUNTER — Ambulatory Visit: Payer: No Typology Code available for payment source | Admitting: Obstetrics

## 2023-07-28 ENCOUNTER — Encounter: Payer: Self-pay | Admitting: Obstetrics

## 2023-07-28 VITALS — BP 113/75 | HR 120 | Wt 199.0 lb

## 2023-07-28 DIAGNOSIS — Z3A30 30 weeks gestation of pregnancy: Secondary | ICD-10-CM

## 2023-07-28 DIAGNOSIS — Z3483 Encounter for supervision of other normal pregnancy, third trimester: Secondary | ICD-10-CM

## 2023-07-28 DIAGNOSIS — J45909 Unspecified asthma, uncomplicated: Secondary | ICD-10-CM

## 2023-07-28 DIAGNOSIS — O99213 Obesity complicating pregnancy, third trimester: Secondary | ICD-10-CM

## 2023-07-28 DIAGNOSIS — O219 Vomiting of pregnancy, unspecified: Secondary | ICD-10-CM

## 2023-07-28 DIAGNOSIS — Z3480 Encounter for supervision of other normal pregnancy, unspecified trimester: Secondary | ICD-10-CM

## 2023-07-28 NOTE — Progress Notes (Signed)
    Return Prenatal Note   Assessment/Plan   Plan  23 y.o. G2P1001 at [redacted]w[redacted]d presents for follow-up OB visit. Reviewed prenatal record including previous visit note.  Supervision of other normal pregnancy, antepartum - Reviewed danger signs.  - Contraception post delivery discussed today- discussing with husband about vasectomy, is also considering pills after delivery.  - Birth plan discussed, plans to go unmedicated, and strongly desires golden hour.  - Anticipatory guidance reviewed, along with kick counts.     No orders of the defined types were placed in this encounter.  Return in about 2 weeks (around 08/11/2023).   Future Appointments  Date Time Provider Department Center  08/11/2023  8:55 AM Doreene Burke, CNM [facility] None    For next visit:  Routine prenatal care    Subjective  Feeling well overall, still having tachycardia episode about 1-2x per week, denies chest pain or shortness of breath with episodes. Denies LOF, VB.   Movement: Present Contractions: Irritability  Objective   Flow sheet Vitals: Pulse Rate: (!) 120 BP: 113/75 Fundal Height: 31 cm Fetal Heart Rate (bpm): 142 Total weight gain: 6.35 kg  General Appearance  No acute distress, well appearing, and well nourished Pulmonary   Normal work of breathing Neurologic   Alert and oriented to person, place, and time Psychiatric   Mood and affect within normal limits  [name], [facility] 07/28/23 10:55 AM

## 2023-07-28 NOTE — Assessment & Plan Note (Signed)
-   Reviewed danger signs.  - Contraception post delivery discussed today- discussing with husband about vasectomy, is also considering pills after delivery.  - Birth plan discussed, plans to go unmedicated, and strongly desires golden hour.  - Anticipatory guidance reviewed, along with kick counts.

## 2023-07-28 NOTE — Progress Notes (Deleted)
    Return Prenatal Note   Assessment/Plan   Plan  23 y.o. G2P1001 at [redacted]w[redacted]d presents for follow-up OB visit. Reviewed prenatal record including previous visit note.  No problem-specific Assessment & Plan notes found for this encounter.    No orders of the defined types were placed in this encounter.  Return in about 2 weeks (around 08/11/2023).   Future Appointments  Date Time Provider Department Center  08/11/2023  8:55 AM Doreene Burke, CNM [facility] None    For next visit:  Routine prenatal care    Subjective  Feeling well overall, still having tachycardia episode about 1-2x per week, denies chest pain or shortness of breathe with episodes. Denies LOF, VB.   Movement: Present Contractions: Irritability  Objective   Flow sheet Vitals: Pulse Rate: (!) 120 BP: 113/75 Total weight gain: 6.35 kg  General Appearance  No acute distress, well appearing, and well nourished Pulmonary   Normal work of breathing Neurologic   Alert and oriented to person, place, and time Psychiatric   Mood and affect within normal limits  Kaitlyn Galloway, CNM 07/28/23 10:48 AM

## 2023-08-01 ENCOUNTER — Observation Stay (HOSPITAL_COMMUNITY)

## 2023-08-01 ENCOUNTER — Other Ambulatory Visit: Payer: Self-pay

## 2023-08-01 ENCOUNTER — Observation Stay (HOSPITAL_COMMUNITY)
Admission: EM | Admit: 2023-08-01 | Discharge: 2023-08-02 | Disposition: A | Discharge status: Home / Self Care | Attending: Obstetrics & Gynecology | Admitting: Obstetrics & Gynecology

## 2023-08-01 ENCOUNTER — Encounter (HOSPITAL_COMMUNITY): Payer: Self-pay

## 2023-08-01 DIAGNOSIS — O99213 Obesity complicating pregnancy, third trimester: Secondary | ICD-10-CM

## 2023-08-01 DIAGNOSIS — J45909 Unspecified asthma, uncomplicated: Secondary | ICD-10-CM | POA: Diagnosis not present

## 2023-08-01 DIAGNOSIS — Z7982 Long term (current) use of aspirin: Secondary | ICD-10-CM | POA: Diagnosis not present

## 2023-08-01 DIAGNOSIS — O4700 False labor before 37 completed weeks of gestation, unspecified trimester: Secondary | ICD-10-CM | POA: Diagnosis present

## 2023-08-01 DIAGNOSIS — E669 Obesity, unspecified: Secondary | ICD-10-CM

## 2023-08-01 DIAGNOSIS — R1030 Lower abdominal pain, unspecified: Secondary | ICD-10-CM | POA: Diagnosis not present

## 2023-08-01 DIAGNOSIS — Z3A30 30 weeks gestation of pregnancy: Secondary | ICD-10-CM | POA: Diagnosis not present

## 2023-08-01 DIAGNOSIS — O4703 False labor before 37 completed weeks of gestation, third trimester: Secondary | ICD-10-CM | POA: Insufficient documentation

## 2023-08-01 DIAGNOSIS — O9A213 Injury, poisoning and certain other consequences of external causes complicating pregnancy, third trimester: Principal | ICD-10-CM | POA: Diagnosis present

## 2023-08-01 DIAGNOSIS — O99513 Diseases of the respiratory system complicating pregnancy, third trimester: Secondary | ICD-10-CM | POA: Insufficient documentation

## 2023-08-01 DIAGNOSIS — Z87891 Personal history of nicotine dependence: Secondary | ICD-10-CM | POA: Insufficient documentation

## 2023-08-01 HISTORY — DX: False labor before 37 completed weeks of gestation, unspecified trimester: O47.00

## 2023-08-01 LAB — COMPREHENSIVE METABOLIC PANEL
ALT: 10 U/L (ref 0–44)
ALT: 10 U/L (ref 0–44)
AST: 17 U/L (ref 15–41)
AST: 16 U/L (ref 15–41)
Albumin: 2.7 g/dL — ABNORMAL LOW (ref 3.5–5.0)
Albumin: 2.5 g/dL — ABNORMAL LOW (ref 3.5–5.0)
Alkaline Phosphatase: 68 U/L (ref 38–126)
Alkaline Phosphatase: 61 U/L (ref 38–126)
Anion gap: 6 (ref 5–15)
Anion gap: 5 (ref 5–15)
BUN: <5 mg/dL — ABNORMAL LOW (ref 6–20)
BUN: <5 mg/dL — ABNORMAL LOW (ref 6–20)
CO2: 21 mmol/L — ABNORMAL LOW (ref 22–32)
CO2: 21 mmol/L — ABNORMAL LOW (ref 22–32)
Calcium: 8.7 mg/dL — ABNORMAL LOW (ref 8.9–10.3)
Calcium: 8.5 mg/dL — ABNORMAL LOW (ref 8.9–10.3)
Chloride: 108 mmol/L (ref 98–111)
Chloride: 108 mmol/L (ref 98–111)
Creatinine, Ser: 0.47 mg/dL (ref 0.44–1.00)
Creatinine, Ser: 0.73 mg/dL (ref 0.44–1.00)
GFR, Estimated: >60 mL/min (ref >60)
GFR, Estimated: >60 mL/min (ref >60)
Glucose, Bld: 86 mg/dL (ref 70–99)
Glucose, Bld: 128 mg/dL — ABNORMAL HIGH (ref 70–99)
Potassium: 3.7 mmol/L (ref 3.5–5.1)
Potassium: 3.4 mmol/L — ABNORMAL LOW (ref 3.5–5.1)
Sodium: 135 mmol/L (ref 135–145)
Sodium: 134 mmol/L — ABNORMAL LOW (ref 135–145)
Total Bilirubin: 0.7 mg/dL (ref 0.0–1.2)
Total Bilirubin: 0.6 mg/dL (ref 0.0–1.2)
Total Protein: 6 g/dL — ABNORMAL LOW (ref 6.5–8.1)
Total Protein: 5.5 g/dL — ABNORMAL LOW (ref 6.5–8.1)

## 2023-08-01 LAB — URINALYSIS, ROUTINE W REFLEX MICROSCOPIC
Bilirubin Urine: NEGATIVE
Glucose, UA: NEGATIVE mg/dL
Hgb urine dipstick: NEGATIVE
Ketones, ur: 20 mg/dL — AB
Nitrite: NEGATIVE
Protein, ur: NEGATIVE mg/dL
Specific Gravity, Urine: 1.016 (ref 1.005–1.030)
pH: 7 (ref 5.0–8.0)

## 2023-08-01 LAB — I-STAT CHEM 8, ED
BUN: <3 mg/dL — ABNORMAL LOW (ref 6–20)
Calcium, Ion: 1.19 mmol/L (ref 1.15–1.40)
Chloride: 107 mmol/L (ref 98–111)
Creatinine, Ser: 0.5 mg/dL (ref 0.44–1.00)
Glucose, Bld: 84 mg/dL (ref 70–99)
HCT: 28 % — ABNORMAL LOW (ref 36.0–46.0)
Hemoglobin: 9.5 g/dL — ABNORMAL LOW (ref 12.0–15.0)
Potassium: 3.7 mmol/L (ref 3.5–5.1)
Sodium: 136 mmol/L (ref 135–145)
TCO2: 21 mmol/L — ABNORMAL LOW (ref 22–32)

## 2023-08-01 LAB — CBC
HCT: 28.7 % — ABNORMAL LOW (ref 36.0–46.0)
HCT: 27.6 % — ABNORMAL LOW (ref 36.0–46.0)
Hemoglobin: 9.3 g/dL — ABNORMAL LOW (ref 12.0–15.0)
Hemoglobin: 8.9 g/dL — ABNORMAL LOW (ref 12.0–15.0)
MCH: 27.5 pg (ref 26.0–34.0)
MCH: 27.1 pg (ref 26.0–34.0)
MCHC: 32.4 g/dL (ref 30.0–36.0)
MCHC: 32.2 g/dL (ref 30.0–36.0)
MCV: 84.9 fL (ref 80.0–100.0)
MCV: 83.9 fL (ref 80.0–100.0)
Platelets: 190 10*3/uL (ref 150–400)
Platelets: 178 10*3/uL (ref 150–400)
RBC: 3.38 MIL/uL — ABNORMAL LOW (ref 3.87–5.11)
RBC: 3.29 MIL/uL — ABNORMAL LOW (ref 3.87–5.11)
RDW: 12.9 % (ref 11.5–15.5)
RDW: 12.9 % (ref 11.5–15.5)
WBC: 4.9 10*3/uL (ref 4.0–10.5)
WBC: 5.2 10*3/uL (ref 4.0–10.5)
nRBC: 0 % (ref 0.0–0.2)
nRBC: 0 % (ref 0.0–0.2)

## 2023-08-01 LAB — SAMPLE TO BLOOD BANK

## 2023-08-01 LAB — TYPE AND SCREEN
ABO/RH(D): A POS
Antibody Screen: NEGATIVE

## 2023-08-01 LAB — I-STAT CG4 LACTIC ACID, ED: Lactic Acid, Venous: 1.1 mmol/L (ref 0.5–1.9)

## 2023-08-01 LAB — PROTIME-INR
INR: 1 (ref 0.8–1.2)
Prothrombin Time: 13.5 s (ref 11.4–15.2)

## 2023-08-01 MED ORDER — LACTATED RINGERS IV SOLN: INTRAVENOUS | Status: DC

## 2023-08-01 MED ORDER — ACETAMINOPHEN 325 MG PO TABS: 650.0000 mg | ORAL_TABLET | ORAL | Status: DC | PRN

## 2023-08-01 MED ORDER — LACTATED RINGERS IV SOLN: 125.0000 mL/h | INTRAVENOUS | Status: DC

## 2023-08-01 MED ORDER — ZOLPIDEM TARTRATE 5 MG PO TABS: 5.0000 mg | ORAL_TABLET | Freq: Every evening | ORAL | Status: DC | PRN

## 2023-08-01 MED ORDER — CALCIUM CARBONATE ANTACID 500 MG PO CHEW: 2.0000 | CHEWABLE_TABLET | ORAL | Status: DC | PRN

## 2023-08-01 MED ORDER — PRENATAL MULTIVITAMIN CH
1.0000 | ORAL_TABLET | Freq: Every day | ORAL | Status: DC
  Administered 2023-08-02: 1 via ORAL
  Filled 2023-08-01: qty 1

## 2023-08-01 MED ORDER — DOCUSATE SODIUM 100 MG PO CAPS: 100.0000 mg | ORAL_CAPSULE | Freq: Every day | ORAL | Status: DC

## 2023-08-01 MED ORDER — MAGNESIUM SULFATE 40 GM/1000ML IV SOLN
2.0000 g/h | INTRAVENOUS | Status: AC
  Administered 2023-08-01: 2 g/h via INTRAVENOUS
  Filled 2023-08-01: qty 1000

## 2023-08-01 MED ORDER — BETAMETHASONE SOD PHOS & ACET 6 (3-3) MG/ML IJ SUSP
12.0000 mg | INTRAMUSCULAR | Status: DC
  Administered 2023-08-01: 12 mg via INTRAMUSCULAR
  Filled 2023-08-01: qty 5

## 2023-08-01 MED ORDER — LACTATED RINGERS IV SOLN
INTRAVENOUS | Status: AC | PRN
  Administered 2023-08-01: 1000 mL/h via INTRAVENOUS

## 2023-08-01 MED ORDER — MAGNESIUM SULFATE BOLUS VIA INFUSION
4.0000 g | Freq: Once | INTRAVENOUS | Status: AC
  Administered 2023-08-01: 4 g via INTRAVENOUS
  Filled 2023-08-01: qty 1000
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
## 2023-08-01 NOTE — ED Notes (Signed)
 Pt states she has had palpitations during pregnancy that the Obgyn is aware of. Pt is anemic

## 2023-08-01 NOTE — Progress Notes (Signed)
 Pt has been cleared by Dr Maple Hudson.  Will proceed to MAU for further fhr monitoring.
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
## 2023-08-01 NOTE — Progress Notes (Signed)
 Dr Debroah Loop made aware of pt and status.  MD would like pt to have four hours of fetal heart rate monitoring in MAU once she is cleared medically.  MAU called and given report.
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
## 2023-08-02 DIAGNOSIS — Z3A30 30 weeks gestation of pregnancy: Secondary | ICD-10-CM | POA: Diagnosis not present

## 2023-08-02 DIAGNOSIS — O9A213 Injury, poisoning and certain other consequences of external causes complicating pregnancy, third trimester: Principal | ICD-10-CM

## 2023-08-02 DIAGNOSIS — O4700 False labor before 37 completed weeks of gestation, unspecified trimester: Secondary | ICD-10-CM | POA: Diagnosis not present

## 2023-08-02 LAB — CBC
HCT: 28.7 % — ABNORMAL LOW (ref 36.0–46.0)
Hemoglobin: 9.2 g/dL — ABNORMAL LOW (ref 12.0–15.0)
MCH: 26.9 pg (ref 26.0–34.0)
MCHC: 32.1 g/dL (ref 30.0–36.0)
MCV: 83.9 fL (ref 80.0–100.0)
Platelets: 192 10*3/uL (ref 150–400)
RBC: 3.42 MIL/uL — ABNORMAL LOW (ref 3.87–5.11)
RDW: 12.8 % (ref 11.5–15.5)
WBC: 4.8 10*3/uL (ref 4.0–10.5)
nRBC: 0 % (ref 0.0–0.2)

## 2023-08-02 LAB — KLEIHAUER-BETKE STAIN
# Vials RhIg: 1
Fetal Cells %: 0 %
Quantitation Fetal Hemoglobin: 0 mL

## 2023-08-02 MED ORDER — BETAMETHASONE SOD PHOS & ACET 6 (3-3) MG/ML IJ SUSP
12.0000 mg | Freq: Once | INTRAMUSCULAR | Status: AC
  Administered 2023-08-02: 12 mg via INTRAMUSCULAR
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
## 2023-08-02 NOTE — Progress Notes (Signed)
 Transition of Care [facility]) - CAGE-AID Screening   Patient Details  Name: Kaitlyn Galloway MRN: [id number] Date of Birth: 05-04-2001  Transition of Care [facility]) CM/SW Contact:    Leota Sauers, RN Phone Number: 08/02/2023, 6:27 AM   Clinical Narrative:  Patient denies use of alcohol and illicit substances. Resources not given at this time.  CAGE-AID Screening:    Have You Ever Felt You Ought to Cut Down on Your Drinking or Drug Use?: No Have People Annoyed You By Critizing Your Drinking Or Drug Use?: No Have You Felt Bad Or Guilty About Your Drinking Or Drug Use?: No Have You Ever Had a Drink or Used Drugs First Thing In The Morning to Steady Your Nerves or to Get Rid of a Hangover?: No CAGE-AID Score: 0  Substance Abuse Education Offered: No

## 2023-08-03 ENCOUNTER — Observation Stay
Admission: EM | Admit: 2023-08-03 | Discharge: 2023-08-03 | Disposition: A | Discharge status: Home / Self Care | Attending: Certified Nurse Midwife | Admitting: Certified Nurse Midwife

## 2023-08-03 ENCOUNTER — Encounter: Payer: Self-pay | Admitting: Obstetrics and Gynecology

## 2023-08-03 ENCOUNTER — Telehealth: Payer: Self-pay

## 2023-08-03 ENCOUNTER — Other Ambulatory Visit: Payer: Self-pay

## 2023-08-03 DIAGNOSIS — Z3A31 31 weeks gestation of pregnancy: Secondary | ICD-10-CM | POA: Insufficient documentation

## 2023-08-03 DIAGNOSIS — Z3480 Encounter for supervision of other normal pregnancy, unspecified trimester: Principal | ICD-10-CM

## 2023-08-03 DIAGNOSIS — O9A213 Injury, poisoning and certain other consequences of external causes complicating pregnancy, third trimester: Secondary | ICD-10-CM

## 2023-08-03 DIAGNOSIS — R102 Pelvic and perineal pain: Secondary | ICD-10-CM | POA: Insufficient documentation

## 2023-08-03 DIAGNOSIS — Z79899 Other long term (current) drug therapy: Secondary | ICD-10-CM | POA: Insufficient documentation

## 2023-08-03 DIAGNOSIS — O26893 Other specified pregnancy related conditions, third trimester: Principal | ICD-10-CM | POA: Insufficient documentation

## 2023-08-03 DIAGNOSIS — Z3A30 30 weeks gestation of pregnancy: Secondary | ICD-10-CM

## 2023-08-03 DIAGNOSIS — O99213 Obesity complicating pregnancy, third trimester: Secondary | ICD-10-CM

## 2023-08-03 DIAGNOSIS — O368130 Decreased fetal movements, third trimester, not applicable or unspecified: Secondary | ICD-10-CM | POA: Diagnosis not present

## 2023-08-03 DIAGNOSIS — R002 Palpitations: Secondary | ICD-10-CM | POA: Diagnosis not present

## 2023-08-03 DIAGNOSIS — O4700 False labor before 37 completed weeks of gestation, unspecified trimester: Secondary | ICD-10-CM

## 2023-08-03 LAB — CULTURE, BETA STREP (GROUP B ONLY)

## 2023-08-03 NOTE — OB Triage Note (Signed)
 Pt being discharged home by [facility] CNM. Cervical exam showed closed cervix. Discharge instructions reviewed and pt verbalized understanding. Discharged ambulatory and stable with her mother.
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
## 2023-08-03 NOTE — Telephone Encounter (Signed)
Notified L&D.

## 2023-08-03 NOTE — Telephone Encounter (Signed)
 Patient advised to report to ED/L&D for evaluation.
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
## 2023-08-11 ENCOUNTER — Ambulatory Visit (INDEPENDENT_AMBULATORY_CARE_PROVIDER_SITE_OTHER): Admitting: Certified Nurse Midwife

## 2023-08-11 VITALS — BP 113/71 | HR 92 | Wt 197.4 lb

## 2023-08-11 DIAGNOSIS — Z3A32 32 weeks gestation of pregnancy: Secondary | ICD-10-CM | POA: Diagnosis not present

## 2023-08-11 DIAGNOSIS — Z3483 Encounter for supervision of other normal pregnancy, third trimester: Secondary | ICD-10-CM

## 2023-08-11 NOTE — Progress Notes (Signed)
 ROB doing well, feeling good movement. Reviewed PTL signs and symptoms. Discussed round ligament pain and self help measures. Follow up 2 wkees.   Doreene Burke, CNM

## 2023-08-24 ENCOUNTER — Ambulatory Visit (INDEPENDENT_AMBULATORY_CARE_PROVIDER_SITE_OTHER): Admitting: Obstetrics and Gynecology

## 2023-08-24 VITALS — BP 105/75 | HR 94 | Wt 201.6 lb

## 2023-08-24 DIAGNOSIS — Z3483 Encounter for supervision of other normal pregnancy, third trimester: Secondary | ICD-10-CM | POA: Diagnosis not present

## 2023-08-24 DIAGNOSIS — Z3A34 34 weeks gestation of pregnancy: Secondary | ICD-10-CM | POA: Diagnosis not present

## 2023-08-24 DIAGNOSIS — O99213 Obesity complicating pregnancy, third trimester: Secondary | ICD-10-CM

## 2023-08-24 DIAGNOSIS — O4700 False labor before 37 completed weeks of gestation, unspecified trimester: Secondary | ICD-10-CM

## 2023-08-24 DIAGNOSIS — Z3480 Encounter for supervision of other normal pregnancy, unspecified trimester: Secondary | ICD-10-CM
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
## 2023-08-24 NOTE — Progress Notes (Signed)
 ROB [redacted]w[redacted]d: She is doing well. She reports good fetal movement. She reports contractions every 30 minutes. She lost her mucous plug 2-3 days ago.

## 2023-09-07 ENCOUNTER — Other Ambulatory Visit (HOSPITAL_COMMUNITY)
Admission: RE | Admit: 2023-09-07 | Discharge: 2023-09-07 | Disposition: A | Discharge status: Home / Self Care | Source: Ambulatory Visit | Attending: Licensed Practical Nurse | Admitting: Licensed Practical Nurse

## 2023-09-07 ENCOUNTER — Ambulatory Visit (INDEPENDENT_AMBULATORY_CARE_PROVIDER_SITE_OTHER): Admitting: Licensed Practical Nurse

## 2023-09-07 VITALS — BP 113/73 | HR 92 | Wt 202.3 lb

## 2023-09-07 DIAGNOSIS — Z3685 Encounter for antenatal screening for Streptococcus B: Secondary | ICD-10-CM

## 2023-09-07 DIAGNOSIS — Z3A36 36 weeks gestation of pregnancy: Secondary | ICD-10-CM

## 2023-09-07 DIAGNOSIS — Z113 Encounter for screening for infections with a predominantly sexual mode of transmission: Secondary | ICD-10-CM | POA: Insufficient documentation

## 2023-09-07 DIAGNOSIS — Z3480 Encounter for supervision of other normal pregnancy, unspecified trimester: Secondary | ICD-10-CM

## 2023-09-07 DIAGNOSIS — Z3403 Encounter for supervision of normal first pregnancy, third trimester: Secondary | ICD-10-CM | POA: Diagnosis not present

## 2023-09-08 LAB — CERVICOVAGINAL ANCILLARY ONLY
Chlamydia: NEGATIVE
Comment: NEGATIVE
Comment: NORMAL
Neisseria Gonorrhea: NEGATIVE

## 2023-09-08 NOTE — Progress Notes (Signed)
    Return Prenatal Note   Subjective   23 y.o. G2P1001 at [redacted]w[redacted]d presents for this follow-up prenatal visit.  Patient here with her husband. Feeling good. Mood is good. Undecided about birth control-vasectomy versus LARC.  Patient reports: Movement: Present Contractions: Irritability  Objective   Flow sheet Vitals: Pulse Rate: 92 BP: 113/73 Fundal Height: 37 cm Fetal Heart Rate (bpm): 143 Presentation: Vertex Dilation: Fingertip Effacement (%): Thick Station: -3 Total weight gain: 17 lb 4.8 oz (7.847 kg)  General Appearance  No acute distress, well appearing, and well nourished Pulmonary   Normal work of breathing Neurologic   Alert and oriented to person, place, and time Psychiatric   Mood and affect within normal limits  Assessment/Plan   Plan  23 y.o. G2P1001 at [redacted]w[redacted]d presents for follow-up OB visit. Reviewed prenatal record including previous visit note.  Supervision of other normal pregnancy, antepartum -They have family to care for her child at home while in labor -her husband and mother for labor support -reviewed previous birth experience, the pt went un medicated last time she would like to do that again-expressed desire to more active/mobile this time.  -TWG 17lbs which is good  -36 wk labs collected  -warning sign reviewed      Orders Placed This Encounter  Procedures   Strep Gp B NAA   No follow-ups on file.   Future Appointments  Date Time Provider Department Center  09/15/2023 10:15 AM Angelita Kendall, CNM [facility] None  09/21/2023 10:55 AM [name], CNM [facility] None  09/28/2023 10:15 AM Phylliss Brenner, CNM [facility] None  10/05/2023 10:15 AM Forestine Igo, CNM [facility] None    For next visit:  continue with routine prenatal care     Mikhael Hendriks [facility], CNM  04/18/255:11 PM
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
## 2023-09-09 LAB — STREP GP B NAA: Strep Gp B NAA: NEGATIVE

## 2023-09-15 ENCOUNTER — Encounter: Payer: Self-pay | Admitting: Advanced Practice Midwife

## 2023-09-15 ENCOUNTER — Ambulatory Visit (INDEPENDENT_AMBULATORY_CARE_PROVIDER_SITE_OTHER): Admitting: Advanced Practice Midwife

## 2023-09-15 VITALS — BP 111/55 | HR 105 | Wt 201.0 lb

## 2023-09-15 DIAGNOSIS — Z3480 Encounter for supervision of other normal pregnancy, unspecified trimester: Secondary | ICD-10-CM | POA: Diagnosis not present

## 2023-09-15 DIAGNOSIS — Z3A37 37 weeks gestation of pregnancy: Secondary | ICD-10-CM | POA: Diagnosis not present

## 2023-09-15 NOTE — Patient Instructions (Signed)
Pain Relief During Labor and Delivery Many things can cause pain during labor and delivery, including: Pressure due to the baby moving through the pelvis. Stretching of tissues due to the baby moving through the birth canal. Muscle tension due to anxiety or nervousness. The uterus tightening (contracting)and relaxing to help move the baby. How do I get pain relief during labor and delivery?  Discuss your pain relief options with your health care provider during your prenatal visits. Explore the options offered by your hospital or birth center. There are many ways to deal with the pain of labor and delivery. You can try relaxation techniques or doing relaxing activities, taking a warm shower or bath (hydrotherapy), or other methods. There are also many medicines available to help control pain. Relaxation techniques and activities Practice relaxation techniques or do relaxing activities, such as: Focused breathing. Meditation. Visualization. Aroma therapy. Listening to your favorite music. Hypnosis. Hydrotherapy Take a warm shower or bath. This may: Provide comfort and relaxation. Lessen your feeling of pain. Reduce the amount of pain medicine needed. Shorten the length of labor. Other methods Try doing other things, such as: Getting a massage or having counterpressure on your back. Applying warm packs or ice packs. Changing positions often, moving around, or using a birthing ball. Medicines You may be given: Pain medicine through an IV or an injection into a muscle. Pain medicine inserted into your spinal column. Injections of sterile water just under the skin on your lower back. Nitrous oxide inhalation therapy, also called laughing gas. What kinds of medicine are available for pain relief? There are two kinds of medicines that can be used to relieve pain during labor and delivery: Analgesics. These medicines decrease pain without causing you to lose feeling or the ability to move  your muscles. Anesthetics. These medicines block feeling in the body and can decrease your ability to move freely. Both kinds of medicine can cause minor side effects, such as nausea, trouble concentrating, and sleepiness. They can also affect the baby's heart rate before birth and his or her breathing after birth. For this reason, health care providers are careful about when and how much medicine is given. Which medicines are used to provide pain relief? Common medicines The most common medicines used to help manage pain during labor and delivery include: Opioids. Opioids are medicines that decrease how much pain you feel (perception of pain). These medicines can be given through an IV or may be used with anesthetics to block pain. Epidural analgesia. Epidural analgesia is given through a very thin tube that is inserted into the lower back. Medicine is delivered continuously to the area near your spinal column nerves (epidural space). After having this treatment, you may be able to move your legs, but you will not be able to walk. Depending on the amount and type of medicine given, you may lose all feeling in the lower half of your body, or you may have some sensation, including the urge to push. This treatment can be used to give pain relief for a vaginal birth. Sometimes, a numbing medicine is injected into the spinal fluid when an epidural catheter is placed. This provides for immediate relief but only lasts for 1-2 hours. Once it wears off, the epidural will provide pain relief. This is called a combined spinal-epidural (CSE) block. Intrathecal analgesia (spinal analgesia). Intrathecal analgesia is similar to epidural analgesia, but the medicine is injected into the spinal fluid instead of the epidural space. It is usually only given once.  It starts to relieve pain quickly, but the pain relief lasts only 1-2 hours. Pudendal block. This block is done by injecting numbing medicine through the wall of  the vagina and into a nerve in the pelvis. Other medicines Other medicines used to help manage pain during labor and delivery include: Local anesthetics. These are used to numb a small area of the body. They may be used along with another kind of medicine or used to numb the nerves of the vagina, cervix, and perineum during the second stage of labor. Spinal block (spinal anesthesia). Spinal anesthesia is similar to spinal analgesia, but the medicine that is used contains longer-acting numbing medicines and pain medicines. This type of anesthesia can be used for a cesarean delivery and allows you to stay awake for the birth of your baby. General anesthetics cause you to lose consciousness so you do not feel pain. They are usually only used for an emergency cesarean delivery. These medicines are given through an IV or a mask or both. These medicines are used as part of a procedure or for an emergency delivery. Summary Women have many options to help them manage the pain associated with labor and delivery. You can try doing relaxing activities, taking a warm shower or bath, or other methods. There are also many medicines available to help control pain during labor and delivery. Talk with your health care provider about what options are available to you. This information is not intended to replace advice given to you by your health care provider. Make sure you discuss any questions you have with your health care provider. Document Revised: 05/02/2022 Document Reviewed: 05/02/2022 Elsevier Patient Education  2024 [employer] Signs and Symptoms of Labor Labor is the body's natural process of moving the baby and the placenta out of the uterus. The process of labor usually starts when the baby is full-term, between 39 and 41 weeks of pregnancy. Signs and symptoms that you are close to going into labor As your body prepares for labor and the birth of your baby, you may notice the following symptoms in  the weeks and days before true labor starts: Passing a small amount of thick, bloody mucus from your vagina. This is called normal bloody show or losing your mucus plug. This may happen more than a week before labor begins, or right before labor begins, as the opening of the cervix starts to widen (dilate). For some women, the entire mucus plug passes at once. For others, pieces of the mucus plug may gradually pass over several days. Your baby moving (dropping) lower in your pelvis to get into position for birth (lightening). When this happens, you may feel more pressure on your bladder and pelvic bone and less pressure on your ribs. This may make it easier to breathe. It may also cause you to need to urinate more often and have problems with bowel movements. Having "practice contractions," also called Braxton Hicks contractions or false labor. These occur at irregular (unevenly spaced) intervals that are more than 10 minutes apart. False labor contractions are common after exercise or sexual activity. They will stop if you change position, rest, or drink fluids. These contractions are usually mild and do not get stronger over time. They may feel like: A backache or back pain. Mild cramps, similar to menstrual cramps. Tightening or pressure in your abdomen. Other early symptoms include: Nausea or loss of appetite. Diarrhea. Having a sudden burst of energy, or feeling very tired. Mood changes. Having trouble  sleeping. Signs and symptoms that labor has begun Signs that you are in labor may include: Having contractions that come at regular (evenly spaced) intervals and increase in intensity. This may feel like more intense tightening or pressure in your abdomen that moves to your back. Contractions may also feel like rhythmic pain in your upper thighs or back that comes and goes at regular intervals. If you are delivering for the first time, this change in intensity of contractions often occurs at a  more gradual pace. If you have given birth before, you may notice a more rapid progression of contraction changes. Feeling pressure in the vaginal area. Your water breaking (rupture of membranes). This is when the sac of fluid that surrounds your baby breaks. Fluid leaking from your vagina may be clear or blood-tinged. Labor usually starts within 24 hours of your water breaking, but it may take longer to begin. Some people may feel a sudden gush of fluid; others may notice repeatedly damp underwear. Follow these instructions at home:  When labor starts, or if your water breaks, call your health care provider or nurse care line. Based on your situation, they will determine when you should go in for an exam. During early labor, you may be able to rest and manage symptoms at home. Some strategies to try at home include: Breathing and relaxation techniques. Taking a warm bath or shower. Listening to music. Using a heating pad on the lower back for pain. If directed, apply heat to the area as often as told by your health care provider. Use the heat source that your health care provider recommends, such as a moist heat pack or a heating pad. Place a towel between your skin and the heat source. Leave the heat on for 20-30 minutes. Remove the heat if your skin turns bright red. This is especially important if you are unable to feel pain, heat, or cold. You have a greater risk of getting burned. Contact a health care provider if: Your labor has started. Your water breaks. You have nausea, vomiting, or diarrhea. Get help right away if: You have painful, regular contractions that are 5 minutes apart or less. Labor starts before you are [redacted] weeks along in your pregnancy. You have a fever. You have bright red blood coming from your vagina. You do not feel your baby moving. You have a severe headache with or without vision problems. You have chest pain or shortness of breath. These symptoms may  represent a serious problem that is an emergency. Do not wait to see if the symptoms will go away. Get medical help right away. Call your local emergency services (911 in the [place] Do not drive yourself to the hospital. Summary Labor is your body's natural process of moving your baby and the placenta out of your uterus. The process of labor usually starts when your baby is full-term, between 47 and 40 weeks of pregnancy. When labor starts, or if your water breaks, call your health care provider or nurse care line. Based on your situation, they will determine when you should go in for an exam. This information is not intended to replace advice given to you by your health care provider. Make sure you discuss any questions you have with your health care provider. Document Revised: 09/22/2020 Document Reviewed: 09/22/2020 Elsevier Patient Education  2024 [employer].
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
## 2023-09-21 ENCOUNTER — Ambulatory Visit (INDEPENDENT_AMBULATORY_CARE_PROVIDER_SITE_OTHER): Admitting: Certified Nurse Midwife

## 2023-09-21 ENCOUNTER — Encounter: Payer: Self-pay | Admitting: Certified Nurse Midwife

## 2023-09-21 VITALS — BP 111/72 | HR 85 | Wt 203.2 lb

## 2023-09-21 DIAGNOSIS — Z3A38 38 weeks gestation of pregnancy: Secondary | ICD-10-CM

## 2023-09-21 NOTE — Patient Instructions (Signed)
 Braxton Hicks Contractions: Self-Care Braxton Hicks contractions may feel like labor contractions, but they're like practice for real labor. They can start when you're halfway through your pregnancy. During the last 2 months of your pregnancy, these contractions may happen more and hurt more. However, they aren't a sign that you're in real labor. What are Deberah Pelton contractions? Braxton Hicks contractions make your belly muscles tighten. They aren't real labor because they don't make your cervix, which is the lowest part of your uterus, open and thin. This tightening of your belly before labor starts can also be called false labor. How to tell the difference between true labor and false labor True labor contractions Last 60-90 seconds. Happen on a pattern. Get stronger and last longer over time. Don't go away when you: Walk. Change positions. Rest. Discomfort often begins in the back and then moves to the front. The cervix opens and thins. False labor contractions Are weak and last a short time. Don't happen on a pattern. Happen farther apart than true labor. May go away when you: Walk. Change positions. Rest. May be noticed more at the end of the day. Discomfort is often only felt in the front of the belly. The cervix usually does not open or thin. Sometimes, the only way to tell if you're in true labor is for your health care provider to check your cervix. Your provider will do a physical exam and may monitor your contractions. If you're in true labor, your provider may send you home with instructions about when to return to the hospital or may send you to the hospital right away. Follow these instructions at home:  Take your medicines only as told. Drink more fluids as told. Dehydration may cause these contractions. Dehydration is when there's not enough water in your body. If Braxton Hicks contractions are making you uncomfortable: Change your position or activity. Sit and  rest in a tub of warm water. Do slow and deep breathing several times an hour. Keep all follow-up visits. Your provider will need to check your health and your baby's health. Contact a health care provider if: Your contractions become: Stronger. More regular. Closer together. You have mucus from your vagina that has blood in it. Get help right away if: You feel your baby moving less than usual. You have any amount of fluid that flows from your vagina without stopping. You have signs or symptoms of labor before 37 weeks of pregnancy, such as: Contractions that are 5 minutes or less apart, or that get stronger and last longer. Sudden, sharp pain in your belly or lower back. These symptoms may be an emergency. Call 911 right away. Do not wait to see if the symptoms will go away. Do not drive yourself to the hospital. This information is not intended to replace advice given to you by your health care provider. Make sure you discuss any questions you have with your health care provider. Document Revised: 12/20/2022 Document Reviewed: 12/20/2022 Elsevier Patient Education  2024 [employer].

## 2023-09-21 NOTE — Progress Notes (Signed)
 ROB doing well, feeling good movement. Denies concerns today. Reviewed labor precautions and kick counts. SVE per pt request 1.5/60/-3. Follow up 1 week or prn .   [name], CNM

## 2023-09-28 ENCOUNTER — Ambulatory Visit: Admitting: Obstetrics

## 2023-09-28 ENCOUNTER — Encounter: Payer: Self-pay | Admitting: Obstetrics

## 2023-09-28 VITALS — BP 101/60 | HR 67 | Wt 200.3 lb

## 2023-09-28 DIAGNOSIS — Z3A39 39 weeks gestation of pregnancy: Secondary | ICD-10-CM

## 2023-09-28 DIAGNOSIS — Z3483 Encounter for supervision of other normal pregnancy, third trimester: Secondary | ICD-10-CM | POA: Diagnosis not present

## 2023-09-28 DIAGNOSIS — O99213 Obesity complicating pregnancy, third trimester: Secondary | ICD-10-CM

## 2023-09-28 DIAGNOSIS — Z3480 Encounter for supervision of other normal pregnancy, unspecified trimester: Secondary | ICD-10-CM

## 2023-09-28 DIAGNOSIS — J45909 Unspecified asthma, uncomplicated: Secondary | ICD-10-CM

## 2023-09-28 NOTE — Assessment & Plan Note (Addendum)
-  Membrane sweep done  -Discussed comfort measures for rib pain -Kaitlyn Galloway prefers to await spontaneous labor and plans an unmedicated birth -Reviewed labor warning signs. Instructed to call office or come to hospital with persistent headache, vision changes, regular contractions, leaking of fluid, decreased fetal movement or vaginal bleeding.

## 2023-09-28 NOTE — Progress Notes (Signed)
    Return Prenatal Note   Assessment/Plan   Plan  23 y.o. G2P1001 at [redacted]w[redacted]d presents for follow-up OB visit. Reviewed prenatal record including previous visit note.  Supervision of other normal pregnancy, antepartum -Membrane sweep done  -Discussed comfort measures for rib pain -Tyrina prefers to await spontaneous labor and plans an unmedicated birth -Reviewed labor warning signs. Instructed to call office or come to hospital with persistent headache, vision changes, regular contractions, leaking of fluid, decreased fetal movement or vaginal bleeding.     No orders of the defined types were placed in this encounter.  Return in about 1 week (around 10/05/2023).   Future Appointments  Date Time Provider Department Center  10/05/2023  9:30 AM [facility] ROOM [facility] None  10/05/2023 10:15 AM Forestine Igo, CNM [facility] None    For next visit:  ROB with NST    Subjective   [place] is tired. She is having some runs of ctx but nothing consistent yet. She is also feeling some rib pain on her left side.  Movement: Present Contractions: Irritability  Objective   Flow sheet Vitals: Pulse Rate: 67 BP: 101/60 Fundal Height: 39 cm Fetal Heart Rate (bpm): 125 Dilation: 1.5 Effacement (%): 50 Station: -2 Total weight gain: 15 lb 4.8 oz (6.94 kg)  General Appearance  No acute distress, well appearing, and well nourished Pulmonary   Normal work of breathing Neurologic   Alert and oriented to person, place, and time Psychiatric   Mood and affect within normal limits  Josue Nip, CNM 09/28/23 2:14 PM

## 2023-10-05 ENCOUNTER — Observation Stay
Admission: EM | Admit: 2023-10-05 | Discharge: 2023-10-06 | Disposition: A | Discharge status: Home / Self Care | Attending: Obstetrics | Admitting: Obstetrics

## 2023-10-05 ENCOUNTER — Other Ambulatory Visit

## 2023-10-05 ENCOUNTER — Ambulatory Visit (INDEPENDENT_AMBULATORY_CARE_PROVIDER_SITE_OTHER): Admitting: Certified Nurse Midwife

## 2023-10-05 VITALS — BP 113/70 | HR 78 | Wt 200.9 lb

## 2023-10-05 DIAGNOSIS — Z3480 Encounter for supervision of other normal pregnancy, unspecified trimester: Secondary | ICD-10-CM

## 2023-10-05 DIAGNOSIS — O471 False labor at or after 37 completed weeks of gestation: Principal | ICD-10-CM | POA: Insufficient documentation

## 2023-10-05 DIAGNOSIS — Z3A40 40 weeks gestation of pregnancy: Secondary | ICD-10-CM | POA: Diagnosis not present

## 2023-10-05 DIAGNOSIS — O9A213 Injury, poisoning and certain other consequences of external causes complicating pregnancy, third trimester: Secondary | ICD-10-CM

## 2023-10-05 DIAGNOSIS — O480 Post-term pregnancy: Secondary | ICD-10-CM

## 2023-10-05 DIAGNOSIS — O99213 Obesity complicating pregnancy, third trimester: Secondary | ICD-10-CM

## 2023-10-05 DIAGNOSIS — Z87891 Personal history of nicotine dependence: Secondary | ICD-10-CM | POA: Insufficient documentation

## 2023-10-05 DIAGNOSIS — O479 False labor, unspecified: Secondary | ICD-10-CM | POA: Diagnosis present

## 2023-10-05 DIAGNOSIS — J45909 Unspecified asthma, uncomplicated: Secondary | ICD-10-CM | POA: Insufficient documentation

## 2023-10-05 DIAGNOSIS — O99513 Diseases of the respiratory system complicating pregnancy, third trimester: Secondary | ICD-10-CM | POA: Insufficient documentation

## 2023-10-05 NOTE — Assessment & Plan Note (Signed)
 Reviewed kick counts and preterm labor warning signs. Instructed to call office or come to hospital with persistent headache, vision changes, regular contractions, leaking of fluid, decreased fetal movement or vaginal bleeding. PDIOL scheduled at 41w, 5am 5/22 if needed

## 2023-10-05 NOTE — Progress Notes (Signed)
    Return Prenatal Note   Subjective   23 y.o. G2P1001 at [redacted]w[redacted]d presents for this follow-up prenatal visit.  Patient ready to meet baby! Feeling occasional contractions. Desires sweep today. Open to 41w IOL. Patient reports: Movement: Present Contractions: Irritability  Objective   Flow sheet Vitals: Pulse Rate: 78 BP: 113/70 Fetal Heart Rate (bpm): 125 (RNST) Presentation: Vertex Dilation: 2 (membranes swept) Effacement (%): 60 Station: -2 Total weight gain: 15 lb 14.4 oz (7.212 kg)  Kaitlyn Galloway 05-30-2000 [redacted]w[redacted]d  Fetus A Non-Stress Test Interpretation for 10/05/23  Indication: Post dates  Fetal Heart Rate A Mode: External Baseline Rate (A): 125 bpm Variability: Moderate Accelerations: 15 x 15 Decelerations: None Scalp Stimulation: Negative Multiple birth?: No  Uterine Activity Mode: Toco Contraction Frequency (min): rare Contraction Duration (sec): 120 Contraction Quality: Mild Resting Time: Adequate  Interpretation (Fetal Testing) Nonstress Test Interpretation: Reactive Overall Impression: Reassuring for gestational age    General Appearance  No acute distress, well appearing, and well nourished Pulmonary   Normal work of breathing Neurologic   Alert and oriented to person, place, and time Psychiatric   Mood and affect within normal limits  Assessment/Plan   Plan  23 y.o. G2P1001 at [redacted]w[redacted]d presents for follow-up OB visit. Reviewed prenatal record including previous visit note.  Supervision of other normal pregnancy, antepartum Reviewed kick counts and preterm labor warning signs. Instructed to call office or come to hospital with persistent headache, vision changes, regular contractions, leaking of fluid, decreased fetal movement or vaginal bleeding. PDIOL scheduled at 41w, 5am 5/22 if needed      No orders of the defined types were placed in this encounter.  Return in 1 week (on 10/12/2023) for IOL.   No future appointments.   For  next visit:  IOL 5/22 5am     Forestine Igo, CNM  10/05/2510:04 PM

## 2023-10-05 NOTE — Patient Instructions (Addendum)
 Nonstress Test A nonstress test is a procedure that is done during pregnancy in order to check the baby's heartbeat. The procedure can help to show if the baby (fetus) is healthy. It is commonly done when: The baby is past his or her due date. The pregnancy is high risk. The baby is moving less than normal. The mother has lost a pregnancy in the past. The health care provider suspects a problem with the baby's growth. There is too much or too little amniotic fluid. The procedure is often done in the third trimester of pregnancy to find out if an early delivery is needed and whether such a delivery is safe. During a nonstress test, the baby's heartbeat is monitored when the baby is resting and when the baby is moving. If the baby is healthy, the heart rate will increase when he or she moves or kicks and will return to normal when he or she rests. Tell a health care provider about: Any allergies you have. Any medical conditions you have. All medicines you are taking, including vitamins, herbs, eye drops, creams, and over-the-counter medicines. Any surgeries you have had. Any past pregnancies you have had. What are the risks? There are no risks to you or your baby from a nonstress test. This procedure should not be painful or uncomfortable. What happens before the procedure? Eat a meal right before the test or as directed by your health care provider. Food may help encourage the baby to move. Use the restroom right before the test. What happens during the procedure?  Two monitors will be placed on your abdomen. One will record the baby's heart rate and the other will record the contractions of your uterus. You may be asked to lie down on your side or to sit upright. You may be given a button to press when you feel your baby move. Your health care provider will listen to your baby's heartbeat and record it. He or she may also watch your baby's heartbeat on a screen. If the baby seems to be  sleeping, you may be asked to drink some juice or soda, eat a snack, or change positions. The procedure may vary among health care providers and hospitals. What can I expect after procedure? Your health care provider will discuss the test results with you and make recommendations for the future. Depending on the results, your health care provider may order additional tests or another course of action. If your health care provider gave you any diet or activity instructions, make sure to follow them. Keep all follow-up visits. This is important. Summary A nonstress test is a procedure that is done during pregnancy in order to check the baby's heartbeat. The procedure can help show if the baby is healthy. The procedure is often done in the third trimester of pregnancy to find out if an early delivery is needed and whether such a delivery is safe. During a nonstress test, the baby's heartbeat is monitored when the baby is resting and when the baby is moving. If the baby is healthy, the heart rate will increase when he or she moves or kicks and will return to normal when he or she rests. Your health care provider will discuss the test results with you and make recommendations for the future. This information is not intended to replace advice given to you by your health care provider. Make sure you discuss any questions you have with your health care provider. Document Revised: 01/20/2021 Document Reviewed: 02/17/2020 Elsevier Patient  Education  2024 [employer]. Third Trimester of Pregnancy  The third trimester of pregnancy is from week 28 through week 40. This is months 7 through 9. The third trimester is a time when your baby is growing fast. Body changes during your third trimester Your body continues to change during this time. The changes usually go away after your baby is born. Physical changes You will continue to gain weight. You may get stretch marks on your hips, belly, and breasts. Your  breasts will keep growing and may hurt. A yellow fluid (colostrum) may leak from your breasts. This is the first milk you're making for your baby. Your hair may grow faster and get thicker. In some cases, you may get hair loss. Your belly button may stick out. You may have more swelling in your hands, face, or ankles. Health changes You may have heartburn. You may feel short of breath. This is caused by the uterus that is now bigger. You may have more aches in the pelvis, back, or thighs. You may have more tingling or numbness in your hands, arms, and legs. You may pee more often. You may have trouble pooping (constipation) or swollen veins in the butt that can itch or get painful (hemorrhoids). Other changes You may have more problems sleeping. You may notice the baby moving lower in your belly (dropping). You may have more fluid coming from your vagina. Your joints may feel loose, and you may have pain around your pelvic bone. Follow these instructions at home: Medicines Take medicines only as told by your health care provider. Some medicines are not safe during pregnancy. Your provider may change the medicines that you take. Do not take any medicines unless told to by your provider. Take a prenatal vitamin that has at least 600 micrograms (mcg) of folic acid. Do not use herbal medicines, illegal drugs, or medicines that are not approved by your provider. Eating and drinking While you're pregnant your body needs additional nutrition to help support your growing baby. Talk with your provider about your nutritional needs. Activity Most women are able to exercise regularly during pregnancy. Exercise routines may need to change at the end of your pregnancy. Talk to your provider about your activities and exercise routine. Relieving pain and discomfort Rest often with your legs raised if you have leg cramps or low back pain. Take warm sitz baths to soothe pain from hemorrhoids. Use  hemorrhoid cream if your provider says it's okay. Wear a good, supportive bra if your breasts hurt. Do not use hot tubs, steam rooms, or saunas. Do not douche. Do not use tampons or scented pads. Safety Talk to your provider before traveling far distances. Wear your seatbelt at all times when you're in a car. Talk to your provider if someone hits you, hurts you, or yells at you. Preparing for birth To prepare for your baby: Take childbirth and breastfeeding classes. Visit the hospital and tour the maternity area. Buy a rear-facing car seat. Learn how to install it in your car. General instructions Avoid cat litter boxes and soil used by cats. These things carry germs that can cause harm to your pregnancy and your baby. Do not drink alcohol, smoke, vape, or use products with nicotine or tobacco in them. If you need help quitting, talk with your provider. Keep all follow-up visits for your third trimester. Your provider will do more exams and tests during this trimester. Write down your questions. Take them to your prenatal visits. Your provider  also will: Talk with you about your overall health. Give you advice or refer you to specialists who can help with different needs, including: Mental health and counseling. Foods and healthy eating. Ask for help if you need help with food. Where to find more information American Pregnancy Association: americanpregnancy.org [employer] of Obstetricians and Gynecologists: acog.org Office on [employer] Health: TravelLesson.ca Contact a health care provider if: You have a headache that does not go away when you take medicine. You have any of these problems: You can't eat or drink. You have nausea and vomiting. You have watery poop (diarrhea) for 2 days or more. You have pain when you pee, or your pee smells bad. You have been sick for 2 days or more and aren't getting better. Contact your provider right away if: You have any of these coming from  your vagina: Abnormal discharge. Bad-smelling fluid. Bleeding. Your baby is moving less than usual. You have signs of labor: You have any contractions, belly cramping, or have pain in your pelvis or lower back before 37 weeks of pregnancy (preterm labor). You have regular contractions that are less than 5 minutes apart. Your water breaks. You have symptoms of high blood pressure or preeclampsia. These include: A severe, throbbing headache that does not go away. Sudden or extreme swelling of your face, hands, legs, or feet. Vision problems: You see spots. You have blurry vision. Your eyes are sensitive to light. If you can't reach your provider, go to an urgent care or emergency room. Get help right away if: You faint, become confused, or can't think clearly. You have chest pain or trouble breathing. You have any kind of injury, such as from a fall or a car crash. These symptoms may be an emergency. Call 911 right away. Do not wait to see if the symptoms will go away. Do not drive yourself to the hospital. This information is not intended to replace advice given to you by your health care provider. Make sure you discuss any questions you have with your health care provider. Document Revised: 02/09/2023 Document Reviewed: 09/09/2022 Elsevier Patient Education  2024 [employer].

## 2023-10-06 ENCOUNTER — Other Ambulatory Visit: Payer: Self-pay

## 2023-10-06 ENCOUNTER — Encounter: Payer: Self-pay | Admitting: Obstetrics

## 2023-10-06 DIAGNOSIS — O471 False labor at or after 37 completed weeks of gestation: Secondary | ICD-10-CM | POA: Diagnosis present

## 2023-10-06 DIAGNOSIS — O480 Post-term pregnancy: Secondary | ICD-10-CM | POA: Diagnosis not present

## 2023-10-06 DIAGNOSIS — O479 False labor, unspecified: Secondary | ICD-10-CM | POA: Diagnosis present

## 2023-10-06 DIAGNOSIS — J45909 Unspecified asthma, uncomplicated: Secondary | ICD-10-CM | POA: Diagnosis not present

## 2023-10-06 DIAGNOSIS — Z3A40 40 weeks gestation of pregnancy: Secondary | ICD-10-CM

## 2023-10-06 DIAGNOSIS — Z87891 Personal history of nicotine dependence: Secondary | ICD-10-CM | POA: Diagnosis not present

## 2023-10-06 DIAGNOSIS — O99513 Diseases of the respiratory system complicating pregnancy, third trimester: Secondary | ICD-10-CM | POA: Diagnosis not present

## 2023-10-06 NOTE — Final Progress Note (Signed)
 OB/Triage Note  Patient ID: Kaitlyn Galloway MRN: [id number] DOB/AGE: Sep 11, 2000 23 y.o.  Subjective  History of Present Illness: The patient is a 23 y.o. female G2P1001 at [redacted]w[redacted]d who presents for labor evaluation. Reports contractions started around 1pm, have gotten slightly stronger. Reports pain 5/10. Reports good FM. Denies VB, LOF, HA, visual changes or RUQ pain.   Past Medical History:  Diagnosis Date   Asthma    DR. PRINGLE   Heart murmur    INNOCENT   Leakage of amniotic fluid 05/04/2021   Preterm uterine contractions 08/01/2023    Past Surgical History:  Procedure Laterality Date   NO PAST SURGERIES      No current facility-administered medications on file prior to encounter.   Current Outpatient Medications on File Prior to Encounter  Medication Sig Dispense Refill   Prenatal Vit-Fe Fumarate-FA (PRENATAL PO) Take by mouth.      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   Social Drivers of Corporate investment banker Strain: Low Risk  (02/10/2023)   Overall Financial Resource Strain (CARDIA)    Difficulty of Paying Living Expenses: Not very hard  Food Insecurity: No Food Insecurity (02/10/2023)   Hunger Vital  Sign    Worried About Running Out of Food in the Last Year: Never true    Ran Out of Food in the Last Year: Never true  Transportation Needs: No Transportation Needs (02/10/2023)   PRAPARE - Administrator, Civil Service (Medical): No    Lack of Transportation (Non-Medical): No  Physical Activity: Sufficiently Active (02/10/2023)   Exercise Vital Sign    Days of Exercise per Week: 3 days    Minutes of Exercise per Session: 60 min  Stress: No Stress Concern Present (02/10/2023)   [employer] of Occupational Health - Occupational Stress Questionnaire    Feeling of Stress : Not at all  Social Connections: Moderately Integrated (02/10/2023)   Social Connection and Isolation Panel [NHANES]    Frequency of Communication with Friends and Family: More than three times a week    Frequency of Social Gatherings with Friends and Family: Three times a week    Attends Religious Services: More than 4 times per year    Active Member of Clubs or Organizations: No    Attends Banker Meetings: Never    Marital Status: Married  Catering manager Violence: Not At Risk (02/10/2023)   Humiliation, Afraid, Rape, and Kick questionnaire    Fear of Current or Ex-Partner: No    Emotionally Abused: No    Physically Abused: No    Sexually Abused: No  Family History  Problem Relation Age of Onset   Healthy Mother    Healthy Father    Healthy Sister    Healthy Brother    Hypertension Maternal Grandmother    Healthy Maternal Grandfather    Healthy Paternal Grandmother    Healthy Paternal Grandfather    Hypertension Maternal Aunt    Breast cancer Other 60   Cancer Other 90       COLON     ROS    Objective  Physical Exam: BP 117/64   Pulse (!) 57   Temp 98.5 F (36.9 C) (Oral)   Resp 18   LMP 12/29/2022   OBGyn Exam  SVE 2cm/70%/-2, vertex   FHT 125, mod variability, pos accels, no decels Toco Ucs q2-19min    Hospital Course: The patient was admitted to [facility]  Triage for observation. Had reactive NST. SVE was unchanged from last SVE in office. Patient states contraction pain is tolerable and would like to go home to labor at home and return once more active.  Assessment: 23 y.o. female G2P1001 at [redacted]w[redacted]d  Likely prodromal labor RNST  Plan: Discharge home 2. Return when in more active labor, if you water breaks or for your scheduled induction 3. Teaching: labor s/sx  Discharge Instructions     Discharge activity:  No Restrictions   Complete by: As directed    Discharge diet:  No restrictions   Complete by: As directed    Discharge instructions   Complete by: As directed    Follow up with your OB provider at your next ROB   LABOR:  When conractions begin, you should start to time them from the beginning of one contraction to the beginning  of the next.  When contractions are 5 - 10 minutes apart or less and have been regular for at least an hour, you should call your health care provider.   Complete by: As directed    No sexual activity restrictions   Complete by: As directed    Notify physician for bleeding from the vagina   Complete by: As directed    Notify physician for blurring of vision or spots before the eyes   Complete by: As directed    Notify physician for chills or fever   Complete by: As directed    Notify physician for fainting spells, "black outs" or loss of consciousness   Complete by: As directed    Notify physician for increase in vaginal discharge   Complete by: As directed    Notify physician for leaking of fluid   Complete by: As directed    Notify physician for pain or burning when urinating   Complete by: As directed    Notify physician for pelvic pressure (sudden increase)   Complete by: As directed    Notify physician for severe or continued nausea or vomiting   Complete by: As directed    Notify physician for sudden gushing of fluid from the vagina (with or without continued leaking)   Complete by: As  directed    Notify physician for sudden, constant, or occasional abdominal pain   Complete by: As directed    Notify physician if baby moving less than usual   Complete by: As directed       Allergies as of 10/06/2023   No Known Allergies      Medication List     TAKE these medications    PRENATAL PO Take by mouth.  Total time spent taking care of this patient: 45 minutes  Signed: Lou Rounds CNM, FNP 10/06/2023, 12:40 AM

## 2023-10-06 NOTE — OB Triage Note (Signed)
 Patient is a 23 yo, G2P1, at 40 weeks and 1 day. Patient presents with complaints of contractions occurring every 6-8 minutes that started at 1300 on 10/05/2023.  Patient denies any vaginal bleeding or LOF. Patient reports +FM. Monitors applied and assessing. Initial fetal heart tone is 130. M. Cowen CNM notified of patients arrival to unit. Plan to SVE

## 2023-10-06 NOTE — Progress Notes (Signed)
 Discharge instructions provided to patient. Patient verbalized understanding. Pt educated on signs and symptoms of labor, vaginal bleeding, LOF, and fetal movement. Red flag signs reviewed by RN. Patient discharged home with significant other and mother in stable condition.

## 2023-10-11 ENCOUNTER — Other Ambulatory Visit: Payer: Self-pay

## 2023-10-11 ENCOUNTER — Encounter: Payer: Self-pay | Admitting: Obstetrics

## 2023-10-11 ENCOUNTER — Inpatient Hospital Stay
Admission: EM | Admit: 2023-10-11 | Discharge: 2023-10-13 | DRG: 807 | Disposition: A | Discharge status: Home / Self Care | Attending: Certified Nurse Midwife | Admitting: Certified Nurse Midwife

## 2023-10-11 DIAGNOSIS — Z3A41 41 weeks gestation of pregnancy: Secondary | ICD-10-CM | POA: Diagnosis not present

## 2023-10-11 DIAGNOSIS — D62 Acute posthemorrhagic anemia: Secondary | ICD-10-CM | POA: Diagnosis not present

## 2023-10-11 DIAGNOSIS — O9A213 Injury, poisoning and certain other consequences of external causes complicating pregnancy, third trimester: Secondary | ICD-10-CM

## 2023-10-11 DIAGNOSIS — O480 Post-term pregnancy: Secondary | ICD-10-CM | POA: Diagnosis present

## 2023-10-11 DIAGNOSIS — O9902 Anemia complicating childbirth: Secondary | ICD-10-CM | POA: Diagnosis present

## 2023-10-11 DIAGNOSIS — Z3A40 40 weeks gestation of pregnancy: Secondary | ICD-10-CM

## 2023-10-11 DIAGNOSIS — Z3480 Encounter for supervision of other normal pregnancy, unspecified trimester: Principal | ICD-10-CM

## 2023-10-11 DIAGNOSIS — O99213 Obesity complicating pregnancy, third trimester: Secondary | ICD-10-CM

## 2023-10-11 DIAGNOSIS — O9903 Anemia complicating the puerperium: Secondary | ICD-10-CM | POA: Diagnosis not present

## 2023-10-11 LAB — CBC
HCT: 32.4 % — ABNORMAL LOW (ref 36.0–46.0)
Hemoglobin: 9.9 g/dL — ABNORMAL LOW (ref 12.0–15.0)
MCH: 23.3 pg — ABNORMAL LOW (ref 26.0–34.0)
MCHC: 30.6 g/dL (ref 30.0–36.0)
MCV: 76.4 fL — ABNORMAL LOW (ref 80.0–100.0)
Platelets: 213 10*3/uL (ref 150–400)
RBC: 4.24 MIL/uL (ref 3.87–5.11)
RDW: 14.9 % (ref 11.5–15.5)
WBC: 6.7 10*3/uL (ref 4.0–10.5)
nRBC: 0 % (ref 0.0–0.2)

## 2023-10-11 MED ORDER — LIDOCAINE HCL (PF) 1 % IJ SOLN
INTRAMUSCULAR | Status: AC
  Filled 2023-10-11: qty 30

## 2023-10-11 MED ORDER — SOD CITRATE-CITRIC ACID 500-334 MG/5ML PO SOLN
30.0000 mL | ORAL | Status: DC | PRN
Start: 2023-10-11 — End: 2023-10-13

## 2023-10-11 MED ORDER — MISOPROSTOL 25 MCG QUARTER TABLET
50.0000 ug | ORAL_TABLET | Freq: Once | ORAL | Status: DC
Start: 2023-10-12 — End: 2023-10-13

## 2023-10-11 MED ORDER — MISOPROSTOL 25 MCG QUARTER TABLET: 25.0000 ug | ORAL_TABLET | Freq: Once | ORAL | Status: DC

## 2023-10-11 MED ORDER — OXYTOCIN-SODIUM CHLORIDE 30-0.9 UT/500ML-% IV SOLN: 1.0000 m[IU]/min | INTRAVENOUS | Status: DC

## 2023-10-11 MED ORDER — OXYTOCIN BOLUS FROM INFUSION
333.0000 mL | Freq: Once | INTRAVENOUS | Status: AC
  Administered 2023-10-12: 333 mL via INTRAVENOUS

## 2023-10-11 MED ORDER — TERBUTALINE SULFATE 1 MG/ML IJ SOLN
0.2500 mg | Freq: Once | INTRAMUSCULAR | Status: DC | PRN
Start: 2023-10-11 — End: 2023-10-13

## 2023-10-11 MED ORDER — OXYTOCIN-SODIUM CHLORIDE 30-0.9 UT/500ML-% IV SOLN
2.5000 [IU]/h | INTRAVENOUS | Status: DC
  Administered 2023-10-12: 2.5 [IU]/h via INTRAVENOUS
  Filled 2023-10-11: qty 500

## 2023-10-11 MED ORDER — LIDOCAINE HCL (PF) 1 % IJ SOLN: 30.0000 mL | INTRAMUSCULAR | Status: DC | PRN

## 2023-10-11 MED ORDER — ONDANSETRON HCL 4 MG/2ML IJ SOLN: 4.0000 mg | Freq: Four times a day (QID) | INTRAMUSCULAR | Status: DC | PRN

## 2023-10-11 MED ORDER — LACTATED RINGERS IV SOLN: INTRAVENOUS | Status: AC

## 2023-10-11 MED ORDER — FENTANYL CITRATE (PF) 100 MCG/2ML IJ SOLN: 50.0000 ug | INTRAMUSCULAR | Status: DC | PRN

## 2023-10-11 MED ORDER — LACTATED RINGERS IV SOLN: 500.0000 mL | INTRAVENOUS | Status: AC | PRN

## 2023-10-11 MED ORDER — OXYTOCIN 10 UNIT/ML IJ SOLN
INTRAMUSCULAR | Status: AC
  Filled 2023-10-11: qty 2

## 2023-10-11 MED ORDER — HYDROXYZINE HCL 25 MG PO TABS: 50.0000 mg | ORAL_TABLET | Freq: Four times a day (QID) | ORAL | Status: DC | PRN

## 2023-10-11 MED ORDER — MISOPROSTOL 200 MCG PO TABS
ORAL_TABLET | ORAL | Status: AC
  Filled 2023-10-11: qty 4

## 2023-10-11 MED ORDER — AMMONIA AROMATIC IN INHA
RESPIRATORY_TRACT | Status: AC
  Filled 2023-10-11: qty 10

## 2023-10-11 MED ORDER — ACETAMINOPHEN 325 MG PO TABS
650.0000 mg | ORAL_TABLET | ORAL | Status: DC | PRN
  Administered 2023-10-12: 650 mg via ORAL
  Filled 2023-10-11 (×2): qty 2
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
## 2023-10-12 ENCOUNTER — Encounter: Payer: Self-pay | Admitting: Obstetrics

## 2023-10-12 DIAGNOSIS — D62 Acute posthemorrhagic anemia: Secondary | ICD-10-CM | POA: Diagnosis not present

## 2023-10-12 DIAGNOSIS — O9903 Anemia complicating the puerperium: Secondary | ICD-10-CM

## 2023-10-12 DIAGNOSIS — O480 Post-term pregnancy: Secondary | ICD-10-CM | POA: Diagnosis not present

## 2023-10-12 DIAGNOSIS — Z3A41 41 weeks gestation of pregnancy: Secondary | ICD-10-CM

## 2023-10-12 LAB — TYPE AND SCREEN
ABO/RH(D): A POS
Antibody Screen: NEGATIVE

## 2023-10-12 LAB — RPR: RPR Ser Ql: NONREACTIVE

## 2023-10-12 MED ORDER — BENZOCAINE-MENTHOL 20-0.5 % EX AERO
1.0000 | INHALATION_SPRAY | CUTANEOUS | Status: DC | PRN
  Administered 2023-10-12: 1 via TOPICAL
  Filled 2023-10-12: qty 56

## 2023-10-12 MED ORDER — IBUPROFEN 600 MG PO TABS
600.0000 mg | ORAL_TABLET | Freq: Four times a day (QID) | ORAL | Status: DC
  Administered 2023-10-12 – 2023-10-13 (×5): 600 mg via ORAL
  Filled 2023-10-12 (×5): qty 1

## 2023-10-12 MED ORDER — ONDANSETRON HCL 4 MG/2ML IJ SOLN: 4.0000 mg | INTRAMUSCULAR | Status: DC | PRN

## 2023-10-12 MED ORDER — DIBUCAINE (PERIANAL) 1 % EX OINT: 1.0000 | TOPICAL_OINTMENT | CUTANEOUS | Status: DC | PRN

## 2023-10-12 MED ORDER — PRENATAL MULTIVITAMIN CH
1.0000 | ORAL_TABLET | Freq: Every day | ORAL | Status: DC
  Administered 2023-10-13: 1 via ORAL
  Filled 2023-10-12: qty 1

## 2023-10-12 MED ORDER — ACETAMINOPHEN 325 MG PO TABS
650.0000 mg | ORAL_TABLET | ORAL | Status: DC | PRN
  Administered 2023-10-12 (×2): 650 mg via ORAL
  Filled 2023-10-12: qty 2

## 2023-10-12 MED ORDER — ONDANSETRON HCL 4 MG PO TABS: 4.0000 mg | ORAL_TABLET | ORAL | Status: DC | PRN

## 2023-10-12 MED ORDER — OXYCODONE HCL 5 MG PO TABS: 10.0000 mg | ORAL_TABLET | ORAL | Status: DC | PRN

## 2023-10-12 MED ORDER — SENNOSIDES-DOCUSATE SODIUM 8.6-50 MG PO TABS: 2.0000 | ORAL_TABLET | ORAL | Status: DC

## 2023-10-12 MED ORDER — METHYLERGONOVINE MALEATE 0.2 MG PO TABS: 0.2000 mg | ORAL_TABLET | ORAL | Status: DC | PRN

## 2023-10-12 MED ORDER — WITCH HAZEL-GLYCERIN EX PADS: 1.0000 | MEDICATED_PAD | CUTANEOUS | Status: DC | PRN

## 2023-10-12 MED ORDER — TETANUS-DIPHTH-ACELL PERTUSSIS 5-2.5-18.5 LF-MCG/0.5 IM SUSY: 0.5000 mL | PREFILLED_SYRINGE | Freq: Once | INTRAMUSCULAR | Status: DC

## 2023-10-12 MED ORDER — FERROUS SULFATE 325 (65 FE) MG PO TABS
325.0000 mg | ORAL_TABLET | Freq: Every day | ORAL | Status: DC
  Administered 2023-10-13: 325 mg via ORAL
  Filled 2023-10-12: qty 1

## 2023-10-12 MED ORDER — COCONUT OIL OIL
1.0000 | TOPICAL_OIL | Status: DC | PRN
  Filled 2023-10-12: qty 7.5

## 2023-10-12 MED ORDER — DOCUSATE SODIUM 100 MG PO CAPS
100.0000 mg | ORAL_CAPSULE | Freq: Two times a day (BID) | ORAL | Status: DC
  Administered 2023-10-13: 100 mg via ORAL
  Filled 2023-10-12: qty 1

## 2023-10-12 MED ORDER — SIMETHICONE 80 MG PO CHEW: 80.0000 mg | CHEWABLE_TABLET | ORAL | Status: DC | PRN

## 2023-10-12 MED ORDER — METHYLERGONOVINE MALEATE 0.2 MG/ML IJ SOLN: 0.2000 mg | INTRAMUSCULAR | Status: DC | PRN

## 2023-10-12 MED ORDER — OXYCODONE HCL 5 MG PO TABS: 5.0000 mg | ORAL_TABLET | ORAL | Status: DC | PRN

## 2023-10-12 NOTE — Discharge Summary (Signed)
 Postpartum Discharge Summary  Date of Service updated 10/13/23      Patient Name: Kaitlyn Galloway DOB: 01/10/2001 MRN: [id number]  Date of admission: 10/11/2023 Delivery date:10/12/2023 Delivering provider: [name] Date of discharge: 10/13/2023  Admitting diagnosis: Postmaturity pregnancy, 40-[redacted] weeks gestation [O48.0] Intrauterine pregnancy: [redacted]w[redacted]d     Secondary diagnosis:  Principal Problem:   Postmaturity pregnancy, 40-[redacted] weeks gestation  Additional problems: none    Discharge diagnosis: Term Pregnancy Delivered                                              Post partum procedures:none Augmentation: AROM Complications: None  Hospital course: Onset of Labor With Vaginal Delivery      23 y.o. yo [id number] at [redacted]w[redacted]d was admitted in Active Labor on 10/11/2023. Labor course was uncomplicated.  Membrane Rupture Time/Date: 4:46 AM,10/12/2023  Delivery Method:Vaginal, Spontaneous Operative Delivery:N/A Episiotomy: None Lacerations:  None Patient had an uncomplicated postpartum course.  She is ambulating, tolerating a regular diet, passing flatus, and urinating well. Patient is discharged home in stable condition on 10/13/23.  Newborn Data: Birth date:10/12/2023 Birth time:6:23 AM Gender:Female Living status:Living Apgars:9 ,9 9 Weight:3740 g  Magnesium  Sulfate received: No BMZ received: No Rhophylac:N/A MMR:N/A T-DaP:pt declined Flu: N/A RSV Vaccine received: No Transfusion:No Immunizations administered: There is no immunization history for the selected administration types on file for this patient.  Physical exam  Vitals:   10/12/23 1544 10/12/23 1939 10/13/23 0015 10/13/23 0802  BP: 101/64 106/60 92/78 101/64  Pulse: 90 84 87 63  Resp: 18 18 20 18   Temp: 98.7 F (37.1 C) 98.6 F (37 C) 98.7 F (37.1 C) 97.8 F (36.6 C)  TempSrc: Oral Oral Axillary Oral  SpO2: 99% 100% 99% 100%  Weight:      Height:       General: alert, cooperative, and no  distress Lochia: appropriate Uterine Fundus: firm Perineum: healing well DVT Evaluation: No evidence of DVT seen on physical exam. Labs: Lab Results  Component Value Date   WBC 6.6 10/13/2023   HGB 8.6 (L) 10/13/2023   HCT 28.3 (L) 10/13/2023   MCV 78.4 (L) 10/13/2023   PLT 214 10/13/2023      Latest Ref Rng & Units 08/01/2023    9:27 PM  CMP  Glucose 70 - 99 mg/dL 147   BUN 6 - 20 mg/dL <5   Creatinine 8.29 - 1.00 mg/dL 5.62   Sodium 130 - 865 mmol/L 134   Potassium 3.5 - 5.1 mmol/L 3.4   Chloride 98 - 111 mmol/L 108   CO2 22 - 32 mmol/L 21   Calcium  8.9 - 10.3 mg/dL 8.5   Total Protein 6.5 - 8.1 g/dL 5.5   Total Bilirubin 0.0 - 1.2 mg/dL 0.6   Alkaline Phos 38 - 126 U/L 61   AST 15 - 41 U/L 16   ALT 0 - 44 U/L 10    Edinburgh Score:    02/10/2023   11:30 AM  Edinburgh Postnatal Depression Scale Screening Tool  I have been able to laugh and see the funny side of things. 0  I have looked forward with enjoyment to things. 0  I have blamed myself unnecessarily when things went wrong. 0  I have been anxious or worried for no good reason. 0  I have felt scared or panicky for no good  reason. 0  Things have been getting on top of me. 1  I have been so unhappy that I have had difficulty sleeping. 0  I have felt sad or miserable. 0  I have been so unhappy that I have been crying. 1  The thought of harming myself has occurred to me. 0  Edinburgh Postnatal Depression Scale Total 2      After visit meds:  Allergies as of 10/13/2023   No Known Allergies      Medication List     TAKE these medications    ferrous sulfate 325 (65 FE) MG tablet Take 1 tablet (325 mg total) by mouth daily with breakfast.   methylergonovine 0.2 MG tablet Commonly known as: METHERGINE Take 1 tablet (0.2 mg total) by mouth 4 (four) times daily for 1 day.   PRENATAL PO Take by mouth.         Discharge home in stable condition Infant Feeding: Breast Infant Disposition:home with  mother Discharge instruction: per After Visit Summary and Postpartum booklet. Activity: Advance as tolerated. Pelvic rest for 6 weeks.  Diet: routine diet Anticipated Birth Control: IUD Postpartum Appointment:2 week video visit, 6 week office visit Additional Postpartum F/U: N/A Future Appointments:No future appointments. Follow up Visit:  Follow-up Information     [name], CNM. Schedule an appointment as soon as possible for a visit.   Specialties: Certified Nurse Midwife, Radiology Why: Video visit in 2 weeks Office visit in 6 weeks Contact information: [address] [place] [address] [phone]                     10/13/2023 Phylliss Brenner, CNM

## 2023-10-12 NOTE — Progress Notes (Signed)
 LABOR NOTE   Kaitlyn Galloway 23 y.o.GP@ at [redacted]w[redacted]d  SUBJECTIVE:  Managing contraction pain well with nitrous  Analgesia: Nitrous Oxide  OBJECTIVE:  BP 115/70   Pulse 96   Temp 98.8 F (37.1 C) (Oral)   Resp 18   Ht 5\' 3"  (1.6 m)   Wt 90.7 kg   LMP 12/29/2022   SpO2 100%   BMI 35.43 kg/m  No intake/output data recorded.  She has shown cervical change. CERVIX: 7 cm:  90%:   -2:   mid position:   soft SVE:   Dilation: 7 Effacement (%): 90 Station: -2 Exam by:: Hildy Lowers, CNM CONTRACTIONS: regular, every 2 minutes FHR: Fetal heart tracing reviewed. Baseline: 135 bpm, Variability: Good {> 6 bpm), Accelerations: Reactive, and Decelerations: Early Category I    Labs: Lab Results  Component Value Date   WBC 6.7 10/11/2023   HGB 9.9 (L) 10/11/2023   HCT 32.4 (L) 10/11/2023   MCV 76.4 (L) 10/11/2023   PLT 213 10/11/2023    ASSESSMENT: 1) Labor curve reviewed.       Progress: Active phase labor.     Membranes: ruptured, blood tinged     AROM        Principal Problem:   Postmaturity pregnancy, 40-[redacted] weeks gestation   PLAN: expectant management  [name], CNM  10/12/2023 4:49 AM

## 2023-10-12 NOTE — Lactation Note (Signed)
 This note was copied from a baby's chart. Lactation Consultation Note  Patient Name: Boy Nathalee Smarr UEAVW'U Date: 10/12/2023 Age:24 hours Reason for consult: Initial assessment;Term   Maternal Data Has patient been taught Hand Expression?: Yes Does the patient have breastfeeding experience prior to this delivery?: Yes How long did the patient breastfeed?: 19-28months  Initial assessment w/ an experienced breastfeeding parent.  Infant is 10hr old.  This was a SVD.  Patient w/ a hx of anemia, elevated BMI, and hx of dystocia w/ G1.   Feeding goal is breastfeeding.  Patient nursed her daughter for 19-9months with no issues.  She stated feeding is going well so far and no concerns at the moment.   Feeding Mother's Current Feeding Choice: Breast Milk  Interventions Interventions: Breast feeding basics reviewed;Education  LC provided education on the following;  milk production expectations, hunger cues, day 1/2 wet/dirty diapers, hand expression, cluster feeding, benefits of STS and arousing infant for a feeding.  Lactation informed patient of feeding infant at least 8 or more times w/in a 24hr period but not exceeding 3hrs. Patient verbalized understanding.   LC provided education on working with hands Shauna Bodkins pumps and what to look out for if she is feeling this pump is not emptying her or pulling strong enough.  LC also discussed not breastfeeding/pumping all in one session, can cause oversupply.  Patient verbalized understanding.  Discharge Pump: Personal;Hands Navin Dogan;Manual (Mom Cozy, Manual Pump) WIC Program: Yes  Outpatient lactation number provided on white board in room.  Consult Status Consult Status: Follow-up Follow-up type: In-patient    Jaspreet Bodner S Yan Okray 10/12/2023, 4:50 PM

## 2023-10-12 NOTE — Progress Notes (Signed)
 LABOR NOTE   [name] 23 y.o.GP@ at [redacted]w[redacted]d  SUBJECTIVE:  Feeling some rectal pressure Analgesia: Labor support without medications  OBJECTIVE:  BP 115/70   Pulse 96   Temp 98.8 F (37.1 C) (Oral)   Resp 18   Ht 5\' 3"  (1.6 m)   Wt 90.7 kg   LMP 12/29/2022   BMI 35.43 kg/m  No intake/output data recorded.  She has shown cervical change. CERVIX: 5 cm:  80%:   -2:   posterior:   firm SVE:   Dilation: 5 Effacement (%): 80 Station: -2 Exam by:: Kaitlyn Galloway, CNM CONTRACTIONS: regular, every 2-4 minutes [id number] monitoring. FHT 130, no accelerations , no decelerations  Category I    Labs: Lab Results  Component Value Date   WBC 6.7 10/11/2023   HGB 9.9 (L) 10/11/2023   HCT 32.4 (L) 10/11/2023   MCV 76.4 (L) 10/11/2023   PLT 213 10/11/2023    ASSESSMENT: 1) Labor curve reviewed.       Progress: Early latent labor.     Membranes: intact           Principal Problem:   Postmaturity pregnancy, 40-[redacted] weeks gestation   PLAN: continue present management   Kaitlyn Galloway, CNM  10/12/2023 1:40 AM

## 2023-10-12 NOTE — Progress Notes (Signed)
 LABOR NOTE   Kaitlyn Galloway 23 y.o.GP@ at [redacted]w[redacted]d  SUBJECTIVE:  Feeling more pressure  Analgesia: Labor support without medications  OBJECTIVE:  BP 115/70   Pulse 96   Temp 98.8 F (37.1 C) (Oral)   Resp 18   Ht 5\' 3"  (1.6 m)   Wt 90.7 kg   LMP 12/29/2022   BMI 35.43 kg/m  No intake/output data recorded.  She has shown cervical change. CERVIX: 6 cm:  90%:   -2:   posterior:   soft SVE:   Dilation: 6 Effacement (%): 90 Station: -2 Exam by:: Hildy Lowers, CNM CONTRACTIONS: regular, every 2 minutes FHR: Fetal heart tracing reviewed. Baseline: 120 bpm, Variability: Good {> 6 bpm), Accelerations: Reactive, and Decelerations: Early Category I    Labs: Lab Results  Component Value Date   WBC 6.7 10/11/2023   HGB 9.9 (L) 10/11/2023   HCT 32.4 (L) 10/11/2023   MCV 76.4 (L) 10/11/2023   PLT 213 10/11/2023    ASSESSMENT: 1) Labor curve reviewed.       Progress: Active phase labor.     Membranes: intact   Principal Problem:   Postmaturity pregnancy, 40-[redacted] weeks gestation   PLAN: expectant management   [name], CNM  10/12/2023 2:56 AM

## 2023-10-13 ENCOUNTER — Encounter: Payer: Self-pay | Admitting: Internal Medicine

## 2023-10-13 ENCOUNTER — Other Ambulatory Visit: Payer: Self-pay

## 2023-10-13 LAB — CBC
HCT: 28.3 % — ABNORMAL LOW (ref 36.0–46.0)
Hemoglobin: 8.6 g/dL — ABNORMAL LOW (ref 12.0–15.0)
MCH: 23.8 pg — ABNORMAL LOW (ref 26.0–34.0)
MCHC: 30.4 g/dL (ref 30.0–36.0)
MCV: 78.4 fL — ABNORMAL LOW (ref 80.0–100.0)
Platelets: 214 10*3/uL (ref 150–400)
RBC: 3.61 MIL/uL — ABNORMAL LOW (ref 3.87–5.11)
RDW: 15 % (ref 11.5–15.5)
WBC: 6.6 10*3/uL (ref 4.0–10.5)
nRBC: 0 % (ref 0.0–0.2)

## 2023-10-13 MED ORDER — METHYLERGONOVINE MALEATE 0.2 MG PO TABS
0.2000 mg | ORAL_TABLET | Freq: Four times a day (QID) | ORAL | 0 refills | Status: AC
  Filled 2023-10-13: qty 3, 1d supply, fill #0

## 2023-10-13 MED ORDER — FERROUS SULFATE 325 (65 FE) MG PO TABS
325.0000 mg | ORAL_TABLET | Freq: Every day | ORAL | 3 refills | Status: AC
  Filled 2023-10-13: qty 90, 90d supply, fill #0

## 2023-10-13 MED ORDER — METHYLERGONOVINE MALEATE 0.2 MG PO TABS
0.2000 mg | ORAL_TABLET | Freq: Four times a day (QID) | ORAL | Status: DC
  Administered 2023-10-13 (×2): 0.2 mg via ORAL
  Filled 2023-10-13 (×2): qty 1

## 2023-10-13 NOTE — Progress Notes (Signed)
Patient discharged home with infant. Discharge instructions and prescriptions given and reviewed with patient. Patient verbalized understanding. Escorted out by auxillary.  

## 2023-10-13 NOTE — Lactation Note (Signed)
 This note was copied from a baby's chart. Lactation Consultation Note  Patient Name: Kaitlyn Galloway [id number] Date: 10/13/2023 Age:24 hours Reason for consult: Follow-up assessment;Term;Mother's request;Difficult latch;Breastfeeding assistance;RN request   Maternal Data This is mom's 2nd baby, SVD. Mom with history of anemia and elevated BMI.  On follow-up visit mom reports baby is latching and detaching from the breast and she has to keep relatching him. He is falling asleep at the breast despite her encouraging him to feed. Mom is an experienced breastfeeding mother and reports she did not have this happening with her daughter. Mom's nipples are sore, intact without cracks. LC assisted mom with breastfeeding. Has patient been taught Hand Expression?: Yes Does the patient have breastfeeding experience prior to this delivery?: Yes How long did the patient breastfeed?: 21  months  Feeding Mother's Current Feeding Choice: Breast Milk Provided mom with tips and strategies to maximize position and latch technique. Baby latches and detaches as mom described. Baby observed sucking on his tongue, keeping his tongue tightly to palate. Attempted use of #24 mm nipple shield. Baby latched briefly and pushed shield out of his mouth, again holding tongue tightly to palate and sucking on his tongue. Mom open to pumping. Discussed with mom use of a slow flow bottle may be therapeutic to help him with feeding. Mom pumped 26 ml's. Baby at first was attempting to push out the bottle nipple however once milk was flowing via slow flow nipple to him he began sucking well and took 20 ml's in 10 minutes. Instructed family on paced bottle feeding. Recommended mom continue to attempt breastfeeding at each feeding session and if baby does not continue at the breast mom to pump and offer breastmilk via slow flow bottle.Mom is agreeable with plan. Mom is also navigating this feed with 51 year old in the room who is adjusting  to her new baby brother.  LATCH Score Latch: Repeated attempts needed to sustain latch, nipple held in mouth throughout feeding, stimulation needed to elicit sucking reflex.  Audible Swallowing: A few with stimulation  Type of Nipple: Everted at rest and after stimulation  Comfort (Breast/Nipple): Filling, red/small blisters or bruises, mild/mod discomfort  Hold (Positioning): Assistance needed to correctly position infant at breast and maintain latch.  LATCH Score: 6   Lactation Tools Discussed/Used  DEBP: use, cleaning of parts and breastmilk storage guidelines for normal newborn at home.  Interventions Interventions: Breast feeding basics reviewed;Assisted with latch;Breast massage;Hand express;Breast compression;Adjust position;Support pillows;Position options;Expressed milk;Coconut oil;DEBP;Education (Term feeding plan)  Discharge Discharge Education: Engorgement and breast care;Warning signs for feeding baby;Outpatient recommendation Pump: Personal;Hands Free;Manual  Consult Status Consult Status: Complete Date: 10/13/23 Follow-up type: In-patient  Update provided to care nurse.  Kaitlyn Galloway 10/13/2023, 1:55 PM

## 2023-10-13 NOTE — Final Progress Note (Signed)
 Post Partum Day 1 Subjective: Kaitlyn Galloway is feeling well overall. She is ambulating, voiding, and tolerating POs without difficulty. Her pain is well-controlled and her bleeding is WNL. She has passed a few clots but bleeding is stable. She denies lightheadedness, fatigue, and SOB. Her mood is stable. Breastfeeding is going well but baby has been sleepy this morning.   Objective: Blood pressure 101/64, pulse 63, temperature 97.8 F (36.6 C), temperature source Oral, resp. rate 18, height 5\' 3"  (1.6 m), weight 90.7 kg, last menstrual period 12/29/2022, SpO2 100%, unknown if currently breastfeeding.  Physical Exam:  General: alert, cooperative, and appears stated age Heart: RRR Lungs: CTAB Lochia: appropriate Uterine Fundus: firm Perineum: healing well DVT Evaluation: No evidence of DVT seen on physical exam.  Recent Labs    10/11/23 2339 10/13/23 0525  HGB 9.9* 8.6*  HCT 32.4* 28.3*    Assessment/Plan: Anemia - PO iron  Will start methergine 0.2 mg q 6h x 24 hours Discharge this afternoon if bleeding remains stable Discharge instructions reviewed Lactation assistance as needed Video visit in 2 weeks Office visit/IUD placement at 6 weeks   LOS: 2 days   Phylliss Brenner, CNM 10/13/2023, 9:15 AM

## 2023-10-13 NOTE — Discharge Instructions (Signed)
Discharge Instructions:  Follow-up Appointment for Baby:  It is best for baby to sleep on a firm surface on his/her back with no extra blankets, stuffed animals, or crib bumpers around them. No co-sleeping with baby in the bed with you. Baby cannot turn his/her neck to move something off their face and they can easily be smothered.   Monitor baby's skin for jaundice. Jaundice can indicate a high level of bilirubin (produced during breakdown of red blood cells). You will see a yellowing of the skin and in the whites of the eyes. We have checked baby's levels prior to leaving but there is still a chance it could increase upon leaving the hospital.   Acrocyanosis (blue colored hands and feet) is normal in a newborn. It is NOT normal for baby's mouth/lips or trunk of body to be any shade of blue. This is a medical emergency.   The umbilical cord will fall off in a week or so. Keep it clean and dry. Do not submerge it in water until it falls off. Give your baby sponge baths until it falls off. Keep the cord outside of the diaper (you can fold down top of diaper).   Baby's skin is very thin and dry right now. This means you only need to give him/her a bath 2-3 times a week, not every day.   Continue to feed baby with cues. Your baby should feed at least 8 times in a 24hr. period. Cluster feeding is also normal where baby will feed constantly over a period of time.  You still need to keep track of how much baby is eating and wet/dry diapers, just like we have been doing here. This ensures baby is getting enough to eat and everything is working properly. The best way to know baby is getting enough is using days of life and how many wet diapers (day 2= 2 wet diapers, day 4= 4 wet diapers, etc.) until you get to day 6 and mom's milk should be in. This means baby should have greater than 6 wet diapers per day. Dirty diapers can be a little different. Baby can have 2 or more dirty diapers per day or they can  sometimes take a break between days with no dirty diapers.   Baby's poop starts out as a black, tarry stool (called meconium) and will last 2-3 days. If baby is breast-fed, the stool will turn to a yellow, seedy appearance.   For concerns about your baby, please call your pediatrician.   For breastfeeding concerns, the lactation consultant can be reached at [phone].  

## 2023-10-26 ENCOUNTER — Telehealth (INDEPENDENT_AMBULATORY_CARE_PROVIDER_SITE_OTHER): Admitting: Certified Nurse Midwife

## 2023-10-26 ENCOUNTER — Encounter: Payer: Self-pay | Admitting: Certified Nurse Midwife

## 2023-10-26 DIAGNOSIS — Z1331 Encounter for screening for depression: Secondary | ICD-10-CM

## 2023-10-26 DIAGNOSIS — F53 Postpartum depression: Secondary | ICD-10-CM | POA: Insufficient documentation

## 2023-10-26 DIAGNOSIS — Z1332 Encounter for screening for maternal depression: Secondary | ICD-10-CM

## 2023-10-26 NOTE — Progress Notes (Signed)
 Virtual Visit via Video Note  I connected with [place] on 10/26/23 at  3:55 PM EDT by a video enabled telemedicine application and verified that I am speaking with the correct person using two identifiers.  Location: Patient: at home Provider: at the office   I discussed the limitations of evaluation and management by telemedicine and the availability of in person appointments. The patient expressed understanding and agreed to proceed.  History of Present Illness: Pt is G2 P2 status post SVD on 10/12/23   Observations/Objective: Doing well at home. States bleeding is minimal , pain is absent. She has her mom for support. She is breastfeeding and that is going well. Her mood over all is "good" . States she " has her moments".  She mild postpartum . She declines use of medications at this time.   Assessment and Plan:  Edinburgh Postnatal Depression Scale - 10/26/23 1440       Edinburgh Postnatal Depression Scale:  In the Past 7 Days   I have been able to laugh and see the funny side of things. 1    I have looked forward with enjoyment to things. 0    I have blamed myself unnecessarily when things went wrong. 1    I have been anxious or worried for no good reason. 2    I have felt scared or panicky for no good reason. 0    Things have been getting on top of me. 2    I have been so unhappy that I have had difficulty sleeping. 0    I have felt sad or miserable. 1    I have been so unhappy that I have been crying. 1    The thought of harming myself has occurred to me. 0    Edinburgh Postnatal Depression Scale Total 8              Follow Up Instructions: In 4 weeks in office , or prn for worsening symptoms   I discussed the assessment and treatment plan with the patient. The patient was provided an opportunity to ask questions and all were answered. The patient agreed with the plan and demonstrated an understanding of the instructions.   The patient was advised to call  back or seek an in-person evaluation if the symptoms worsen or if the condition fails to improve as anticipated.  I provided 10 minutes of non-face-to-face time during this encounter.   [name], CNM

## 2023-11-21 ENCOUNTER — Ambulatory Visit (INDEPENDENT_AMBULATORY_CARE_PROVIDER_SITE_OTHER): Admitting: Certified Nurse Midwife

## 2023-11-21 ENCOUNTER — Encounter: Payer: Self-pay | Admitting: Certified Nurse Midwife

## 2023-11-21 VITALS — BP 111/70 | HR 57 | Wt 179.8 lb

## 2023-11-21 DIAGNOSIS — Z30430 Encounter for insertion of intrauterine contraceptive device: Secondary | ICD-10-CM

## 2023-11-21 DIAGNOSIS — Z124 Encounter for screening for malignant neoplasm of cervix: Secondary | ICD-10-CM

## 2023-11-21 DIAGNOSIS — Z3202 Encounter for pregnancy test, result negative: Secondary | ICD-10-CM | POA: Diagnosis not present

## 2023-11-21 DIAGNOSIS — Z7251 High risk heterosexual behavior: Secondary | ICD-10-CM

## 2023-11-21 LAB — POCT URINE PREGNANCY: Preg Test, Ur: NEGATIVE

## 2023-11-21 MED ORDER — LEVONORGESTREL 20 MCG/DAY IU IUD
1.0000 | INTRAUTERINE_SYSTEM | Freq: Once | INTRAUTERINE | Status: AC
  Administered 2023-11-21: 1 via INTRAUTERINE

## 2023-11-21 NOTE — Patient Instructions (Signed)

## 2023-11-21 NOTE — Progress Notes (Signed)
    GYNECOLOGY OFFICE PROCEDURE NOTE  Anaise SHEMIA BEVEL is a 23 y.o. [phone] here for Mirena  IUD insertion. No GYN concerns.  Last pap smear was on 10/13/2020 and was normal.  IUD Insertion Procedure Note Patient identified, informed consent performed, consent signed.   Discussed risks of irregular bleeding, cramping, infection, malpositioning or misplacement of the IUD outside the uterus which may require further procedure such as laparoscopy. Also discussed >99% contraception efficacy, increased risk of ectopic pregnancy with failure of method.   Emphasized that this did not protect against STIs, condoms recommended during all sexual encounters. Time out was performed.  Urine pregnancy test negative.  Speculum placed in the vagina.  Cervix visualized.  Cleaned with Betadine x 2.  Grasped anteriorly with a single tooth tenaculum.  Uterus sounded to 9 cm.  Mirena  IUD placed per manufacturer's recommendations.  Strings trimmed to 3 cm. Tenaculum was removed, good hemostasis noted.  Patient tolerated procedure well.   Patient was given post-procedure instructions.  She was advised to have backup contraception for one week.  Patient was also asked to check IUD strings periodically and follow up in 4 weeks for IUD check.   Zelda Hummer, CNM

## 2023-11-21 NOTE — Progress Notes (Signed)
 Subjective:    Kaitlyn Galloway is a 23 y.o. [id number] African American female who presents for a postpartum visit. She is 6 week postpartum following a spontaneous vaginal delivery at 41 gestational weeks. Anesthesia: none. I have fully reviewed the prenatal and intrapartum course. Postpartum course has been normal. Baby's course has been normal. Baby is feeding by breast. Bleeding no bleeding. Bowel function is normal. Bladder function is normal. Patient is sexually active. Last sexual activity: a few days ago. Contraception method is IUD placed today. Postpartum depression screening: mild . Score 7. Has improved since 2 week vv. She declines need for counseling and meds at this time.  Last pap 10/13/20 and was negative.  The following portions of the patient's history were reviewed and updated as appropriate: allergies, current medications, past medical history, past surgical history and problem list.  Review of Systems Pertinent items are noted in HPI.   Vitals:   11/21/23 1020  BP: 111/70  Pulse: (!) 57  Weight: 179 lb 12.8 oz (81.6 kg)   Patient's last menstrual period was 12/29/2022.  Objective:   General:  alert, cooperative and no distress   Breasts:  deferred, no complaints  Lungs: clear to auscultation bilaterally  Heart:  regular rate and rhythm  Abdomen: soft, nontender   Vulva: normal  Vagina: normal vagina  Cervix:  closed  Corpus: Well-involuted  Adnexa:  Non-palpable  Rectal Exam: mild hemorrhoids          Edinburgh Postnatal Depression Scale - 11/21/23 1022       Edinburgh Postnatal Depression Scale:  In the Past 7 Days   I have been able to laugh and see the funny side of things. 1    I have looked forward with enjoyment to things. 1    I have blamed myself unnecessarily when things went wrong. 1    I have been anxious or worried for no good reason. 1    I have felt scared or panicky for no good reason. 0    Things have been getting on top of me. 1    I have  been so unhappy that I have had difficulty sleeping. 0    I have felt sad or miserable. 1    I have been so unhappy that I have been crying. 1    The thought of harming myself has occurred to me. 0    Edinburgh Postnatal Depression Scale Total 7           Assessment:   Postpartum exam 6 wks s/p SVD Breast feeding Depression screening Contraception counseling   Plan:  : IUD Follow up in: 4 weeks for IUD check or earlier if needed  Zelda Hummer, CNM

## 2023-12-20 ENCOUNTER — Ambulatory Visit: Admitting: Certified Nurse Midwife

## 2023-12-20 DIAGNOSIS — Z30431 Encounter for routine checking of intrauterine contraceptive device: Secondary | ICD-10-CM

## 2024-06-18 ENCOUNTER — Other Ambulatory Visit: Payer: Self-pay

## 2024-06-18 ENCOUNTER — Emergency Department
Admission: EM | Admit: 2024-06-18 | Discharge: 2024-06-18 | Disposition: A | Discharge status: Home / Self Care | Attending: Emergency Medicine | Admitting: Emergency Medicine

## 2024-06-18 DIAGNOSIS — Z23 Encounter for immunization: Secondary | ICD-10-CM | POA: Diagnosis not present

## 2024-06-18 DIAGNOSIS — S41151A Open bite of right upper arm, initial encounter: Secondary | ICD-10-CM | POA: Insufficient documentation

## 2024-06-18 DIAGNOSIS — W540XXA Bitten by dog, initial encounter: Secondary | ICD-10-CM | POA: Diagnosis not present

## 2024-06-18 MED ORDER — AMOXICILLIN-POT CLAVULANATE 875-125 MG PO TABS: 1.0000 | ORAL_TABLET | Freq: Two times a day (BID) | ORAL | 0 refills | Status: AC

## 2024-06-18 MED ORDER — TETANUS-DIPHTH-ACELL PERTUSSIS 5-2-15.5 LF-MCG/0.5 IM SUSP
0.5000 mL | Freq: Once | INTRAMUSCULAR | Status: AC
  Administered 2024-06-18: 0.5 mL via INTRAMUSCULAR
  Filled 2024-06-18: qty 0.5

## 2024-06-18 MED ORDER — AMOXICILLIN-POT CLAVULANATE 875-125 MG PO TABS
1.0000 | ORAL_TABLET | Freq: Once | ORAL | Status: AC
  Administered 2024-06-18: 1 via ORAL
  Filled 2024-06-18: qty 1

## 2024-06-18 NOTE — ED Triage Notes (Signed)
 Pt was delivering a package and got bit by a pitbull. Bleeding is controlled at this time.

## 2024-06-18 NOTE — ED Notes (Signed)
 RN called and notified [place] [employer] communications and talked to Ginger about dog bite. Ginger sts  I will notify animal control.

## 2024-06-18 NOTE — ED Notes (Signed)
 Animal control contacted and will call pt.  Pt aware   d/c inst to pt

## 2024-06-18 NOTE — Discharge Instructions (Addendum)
 Keep the wound clean, dry, and covered.  Use warm soap and water to cleanse the wound daily as necessary.  Take antibiotic twice daily until all pills are completed.  Follow-up with local animal control to determine the disposition of the animal's vaccine or quarantine status.

## 2024-06-18 NOTE — ED Provider Notes (Signed)
 "   [facility] Emergency Department Provider Note     Event Date/Time   First MD Initiated Contact with Patient 06/18/24 1434     (approximate)   History   Animal Bite   HPI  Kaitlyn Galloway is a 24 y.o. female with a noncontributory medication, who presents to the ED for evaluation of work-related injury.  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  She reports a single dog bite to the right upper extremity around the bicep.  She presents to the ED for evaluation of work-related injury.  She is unclear of her current tetanus status.  Patient reports she is breast-feeding an infant at home but denies any other concerns.  She has been in communication with the dog owner, who reports that the dog is up-to-date on routine rabies vaccination.  [facility] animal control is also been notified.  Physical Exam   Triage Vital Signs: ED Triage Vitals  Encounter Vitals Group     BP 06/18/24 1340 136/88     Girls Systolic BP Percentile --      Girls Diastolic BP Percentile --      Boys Systolic BP Percentile --      Boys Diastolic BP Percentile --      Pulse Rate 06/18/24 1340 (!) 124     Resp 06/18/24 1340 17     Temp 06/18/24 1340 98.6 F (37 C)     Temp Source 06/18/24 1340 Oral     SpO2 06/18/24 1340 99 %     Weight 06/18/24 1341 185 lb (83.9 kg)     Height 06/18/24 1341 5' 3 (1.6 m)     Head Circumference --      Peak Flow --      Pain Score 06/18/24 1341 5     Pain Loc --      Pain Education --      Exclude from Growth Chart --     Most recent vital signs: Vitals:   06/18/24 1340  BP: 136/88  Pulse: (!) 124  Resp: 17  Temp: 98.6 F (37 C)  SpO2: 99%    General Awake, no distress. NAD HEENT NCAT. PERRL. EOMI. No rhinorrhea. Mucous membranes are moist.  CV:  Good peripheral perfusion.  RESP:  Normal effort.  MSK:  RUE with several superficial abrasions and punctures to the upper arm  consistent with dog bite history.  Actively noted at this time.  Range of motion to the RUE.   ED Results / Procedures / Treatments   Labs (all labs ordered are listed, but only abnormal results are displayed) Labs Reviewed - No data to display   EKG   RADIOLOGY  No results found.   PROCEDURES:  Critical Care performed: No  Procedures   MEDICATIONS ORDERED IN ED: Medications  Tdap (ADACEL ) injection 0.5 mL (0.5 mLs Intramuscular Given 06/18/24 1511)  amoxicillin -clavulanate (AUGMENTIN ) 875-125 MG per tablet 1 tablet (1 tablet Oral Given 06/18/24 1512)     IMPRESSION / MDM / ASSESSMENT AND PLAN / ED COURSE  I reviewed the triage vital signs and the nursing notes.                              Differential diagnosis includes, but is not limited to, dog bite, abrasion, laceration  Patient's presentation is most consistent with acute, uncomplicated illness.  Patient's  diagnosis is consistent with dog bite to right upper extremity.  Patient treated and evaluated in the ED for superficial wound to the RUE.  No acute neuromuscular evidence on exam.  Patient is tetanus is updated and she started empirically on Augmentin .  Animal control has been notified, and they will contact the patient via telephone for follow-up.  Patient will be discharged home with prescriptions for Augmentin . Patient is to follow up with her PCP or this ED as suggested, as needed or otherwise directed. Patient is given ED precautions to return to the ED for any worsening or new symptoms.  FINAL CLINICAL IMPRESSION(S) / ED DIAGNOSES   Final diagnoses:  Dog bite, initial encounter     Rx / DC Orders   ED Discharge Orders          Ordered    amoxicillin -clavulanate (AUGMENTIN ) 875-125 MG tablet  2 times daily        06/18/24 1519             Note:  This document was prepared using Dragon voice recognition software and may include unintentional dictation errors.    Loyd Candida LULLA Aldona,  PA-C 06/18/24 1541    Dorothyann Drivers, MD 06/18/24 1915  "
